# Patient Record
Sex: Female | Born: 1965 | Race: White | Hispanic: No | Marital: Single | Smoking: Never smoker
Health system: Southern US, Community
[De-identification: ages and names within clinical notes are randomized; demographics above are authoritative.]

## PROBLEM LIST (undated history)

## (undated) DIAGNOSIS — I10 Essential (primary) hypertension: Secondary | ICD-10-CM

## (undated) DIAGNOSIS — R42 Dizziness and giddiness: Secondary | ICD-10-CM

---

## 2015-03-21 ENCOUNTER — Emergency Department (INDEPENDENT_AMBULATORY_CARE_PROVIDER_SITE_OTHER)
Admission: EM | Admit: 2015-03-21 | Discharge: 2015-03-21 | Disposition: A | Discharge status: Home / Self Care | Payer: 59 | Source: Home / Self Care | Attending: Family Medicine | Admitting: Family Medicine

## 2015-03-21 ENCOUNTER — Encounter (HOSPITAL_COMMUNITY): Payer: Self-pay | Admitting: Emergency Medicine

## 2015-03-21 DIAGNOSIS — S39012A Strain of muscle, fascia and tendon of lower back, initial encounter: Secondary | ICD-10-CM

## 2015-03-21 MED ORDER — IBUPROFEN 800 MG PO TABS: 800.0000 mg | ORAL_TABLET | Freq: Three times a day (TID) | ORAL | Status: DC

## 2015-03-21 MED ORDER — CYCLOBENZAPRINE HCL 5 MG PO TABS: 5.0000 mg | ORAL_TABLET | Freq: Three times a day (TID) | ORAL | Status: DC | PRN

## 2015-03-21 NOTE — ED Notes (Signed)
Back pain that started today.

## 2015-03-21 NOTE — Discharge Instructions (Signed)
Heat and medicine as needed, avoid prolonged sitting as discussed, see orthopedist if further problems.

## 2015-03-21 NOTE — ED Provider Notes (Signed)
CSN: [id number]     Arrival date & time 03/21/15  1624 History   First MD Initiated Contact with Patient 03/21/15 1649     No chief complaint on file.  (Consider location/radiation/quality/duration/timing/severity/associated sxs/prior Treatment) Patient is a 49 y.o. female presenting with back pain. The history is provided by the patient.  Back Pain Location:  Lumbar spine Quality:  Shooting Radiates to:  Does not radiate Pain severity:  Moderate Onset quality:  Sudden Duration:  1 day Timing:  Constant Chronicity:  Recurrent (had back strain 1 week ago, mostly resolved over week, bent over today and pain recurred) Context: recent injury   Worsened by:  Nothing tried Ineffective treatments:  None tried Associated symptoms: no abdominal pain, no dysuria, no fever, no leg pain, no numbness, no paresthesias, no tingling and no weakness   Risk factors: lack of exercise and obesity     No past medical history on file. No past surgical history on file. No family history on file. Social History  Substance Use Topics  . Smoking status: Not on file  . Smokeless tobacco: Not on file  . Alcohol Use: Not on file   OB History    No data available     Review of Systems  Constitutional: Negative for fever.  Gastrointestinal: Negative.  Negative for abdominal pain.  Genitourinary: Negative.  Negative for dysuria.  Musculoskeletal: Positive for myalgias and back pain. Negative for joint swelling and gait problem.  Skin: Negative.   Neurological: Negative for tingling, weakness, numbness and paresthesias.    Allergies  Review of patient's allergies indicates not on file.  Home Medications   Prior to Admission medications   Medication Sig Start Date End Date Taking? Authorizing Provider  cyclobenzaprine (FLEXERIL) 5 MG tablet Take 1 tablet (5 mg total) by mouth 3 (three) times daily as needed for muscle spasms. 03/21/15   Linna Hoff, MD  ibuprofen (ADVIL,MOTRIN) 800 MG tablet Take  1 tablet (800 mg total) by mouth 3 (three) times daily. 03/21/15   Linna Hoff, MD   Meds Ordered and Administered this Visit  Medications - No data to display  BP 102/58 mmHg  Pulse 97  Temp(Src) 98.1 F (36.7 C) (Oral)  Resp 16  SpO2 100% No data found.   Physical Exam  Constitutional: She is oriented to person, place, and time. She appears well-developed and well-nourished. She appears distressed.  Abdominal: Soft. Bowel sounds are normal.  Musculoskeletal: She exhibits tenderness.       Lumbar back: She exhibits decreased range of motion, tenderness, pain and spasm. She exhibits no swelling.  Neurological: She is alert and oriented to person, place, and time.  Skin: Skin is warm and dry.  Nursing note and vitals reviewed.   ED Course  Procedures (including critical care time)  Labs Review Labs Reviewed - No data to display  Imaging Review No results found.   Visual Acuity Review  Right Eye Distance:   Left Eye Distance:   Bilateral Distance:    Right Eye Near:   Left Eye Near:    Bilateral Near:         MDM   1. Low back strain, initial encounter        Linna Hoff, MD 03/21/15 1730

## 2016-12-29 ENCOUNTER — Encounter (HOSPITAL_COMMUNITY): Payer: Self-pay

## 2016-12-29 ENCOUNTER — Ambulatory Visit (HOSPITAL_COMMUNITY): Payer: Managed Care, Other (non HMO)

## 2016-12-29 ENCOUNTER — Ambulatory Visit (HOSPITAL_COMMUNITY)
Admission: EM | Admit: 2016-12-29 | Discharge: 2016-12-29 | Disposition: A | Discharge status: Home / Self Care | Payer: Managed Care, Other (non HMO) | Attending: Family Medicine | Admitting: Family Medicine

## 2016-12-29 ENCOUNTER — Ambulatory Visit (INDEPENDENT_AMBULATORY_CARE_PROVIDER_SITE_OTHER): Payer: Managed Care, Other (non HMO)

## 2016-12-29 DIAGNOSIS — W19XXXA Unspecified fall, initial encounter: Secondary | ICD-10-CM | POA: Diagnosis not present

## 2016-12-29 DIAGNOSIS — S93602A Unspecified sprain of left foot, initial encounter: Secondary | ICD-10-CM | POA: Diagnosis not present

## 2016-12-29 MED ORDER — NAPROXEN 500 MG PO TABS: 500.0000 mg | ORAL_TABLET | Freq: Two times a day (BID) | ORAL | 0 refills | Status: DC

## 2016-12-29 NOTE — ED Provider Notes (Signed)
CSN: [id number]     Arrival date & time 12/29/16  [id number] History   None    Chief Complaint  Patient presents with  . Fall   (Consider location/radiation/quality/duration/timing/severity/associated sxs/prior Treatment) Patient fell and injured her left foot and she has left foot pain.   The history is provided by the patient.  Fall  This is a new problem. The problem occurs constantly. The problem has not changed since onset.Nothing aggravates the symptoms. Nothing relieves the symptoms. She has tried nothing for the symptoms.    History reviewed. No pertinent past medical history. History reviewed. No pertinent surgical history. No family history on file. Social History  Substance Use Topics  . Smoking status: Never Smoker  . Smokeless tobacco: Never Used  . Alcohol use No   OB History    No data available     Review of Systems  Constitutional: Negative.   HENT: Negative.   Eyes: Negative.   Respiratory: Negative.   Cardiovascular: Negative.   Gastrointestinal: Negative.   Endocrine: Negative.   Genitourinary: Negative.   Musculoskeletal: Positive for arthralgias.  Allergic/Immunologic: Negative.   Neurological: Negative.   Hematological: Negative.   Psychiatric/Behavioral: Negative.     Allergies  Patient has no known allergies.  Home Medications   Prior to Admission medications   Medication Sig Start Date End Date Taking? Authorizing Provider  cyclobenzaprine (FLEXERIL) 5 MG tablet Take 1 tablet (5 mg total) by mouth 3 (three) times daily as needed for muscle spasms. 03/21/15   Linna HoffKindl, James D, MD  ibuprofen (ADVIL,MOTRIN) 800 MG tablet Take 1 tablet (800 mg total) by mouth 3 (three) times daily. 03/21/15   Linna HoffKindl, James D, MD  naproxen (NAPROSYN) 500 MG tablet Take 1 tablet (500 mg total) by mouth 2 (two) times daily with a meal. 12/29/16   [place], Anselm PancoastWilliam J, FNP  naproxen sodium (ANAPROX) 220 MG tablet Take 220 mg by mouth 2 (two) times daily with a meal.     [provider]   Meds Ordered and Administered this Visit  Medications - No data to display  BP (!) 144/93   Pulse 91   Temp 98.3 F (36.8 C) (Oral)   Resp 20   LMP 10/04/2016 (Approximate) Comment: denies pregnancy  SpO2 100%  No data found.   Physical Exam  Constitutional: She appears well-developed and well-nourished.  HENT:  Head: Normocephalic.  Right Ear: External ear normal.  Left Ear: External ear normal.  Mouth/Throat: Oropharynx is clear and moist.  Eyes: Conjunctivae and EOM are normal. Pupils are equal, round, and reactive to light.  Neck: Normal range of motion. Neck supple.  Cardiovascular: Normal rate, regular rhythm and normal heart sounds.   Pulmonary/Chest: Effort normal and breath sounds normal.  Musculoskeletal: She exhibits tenderness.  Left foot pain at first metacarpal and first toe.  Nursing note and vitals reviewed.   Urgent Care Course     Procedures (including critical care time)  Labs Review Labs Reviewed - No data to display  Imaging Review Dg Foot Complete Left  Result Date: 12/29/2016 CLINICAL DATA:  Left foot pain at the base of the great toe and in the region of the second and third metatarsals after slipping on water and falling down steps last night. EXAM: LEFT FOOT - COMPLETE 3+ VIEW COMPARISON:  None. FINDINGS: There is no evidence of fracture or dislocation. There is no evidence of arthropathy or other focal bone abnormality. Soft tissues are unremarkable. IMPRESSION: Normal examination. Electronically Signed  By: Beckie Salts M.D.   On: 12/29/2016 10:50     Visual Acuity Review  Right Eye Distance:   Left Eye Distance:   Bilateral Distance:    Right Eye Near:   Left Eye Near:    Bilateral Near:         MDM   1. Fall, initial encounter   2. Foot sprain, left, initial encounter    Cam walker left foot Naprosyn 500mg  one po bid x 10 days      Deatra Canter, FNP 12/29/16 1129    Deatra Canter, FNP 12/29/16 1224

## 2016-12-29 NOTE — ED Triage Notes (Signed)
Pt said she fell outside her house on concrete steps last night and hurt her left foot. Said it hurts to walk on it and it's swollen. Did take tylenol. Does work on her feet so she wanted to make sure it's okay before work today

## 2017-02-14 ENCOUNTER — Ambulatory Visit (HOSPITAL_COMMUNITY)
Admission: EM | Admit: 2017-02-14 | Discharge: 2017-02-14 | Disposition: A | Discharge status: Home / Self Care | Payer: Managed Care, Other (non HMO) | Source: Home / Self Care | Attending: Family Medicine | Admitting: Family Medicine

## 2017-02-14 ENCOUNTER — Encounter (HOSPITAL_COMMUNITY): Payer: Self-pay | Admitting: Emergency Medicine

## 2017-02-14 ENCOUNTER — Encounter (HOSPITAL_COMMUNITY): Payer: Self-pay

## 2017-02-14 DIAGNOSIS — R531 Weakness: Secondary | ICD-10-CM | POA: Insufficient documentation

## 2017-02-14 DIAGNOSIS — I10 Essential (primary) hypertension: Secondary | ICD-10-CM | POA: Insufficient documentation

## 2017-02-14 DIAGNOSIS — Z79899 Other long term (current) drug therapy: Secondary | ICD-10-CM | POA: Diagnosis not present

## 2017-02-14 DIAGNOSIS — R42 Dizziness and giddiness: Secondary | ICD-10-CM

## 2017-02-14 HISTORY — DX: Essential (primary) hypertension: I10

## 2017-02-14 HISTORY — DX: Dizziness and giddiness: R42

## 2017-02-14 LAB — URINALYSIS, ROUTINE W REFLEX MICROSCOPIC
BILIRUBIN URINE: NEGATIVE
Glucose, UA: NEGATIVE mg/dL
Hgb urine dipstick: NEGATIVE
Ketones, ur: NEGATIVE mg/dL
LEUKOCYTES UA: NEGATIVE
NITRITE: NEGATIVE
PROTEIN: NEGATIVE mg/dL
Specific Gravity, Urine: 1.01 (ref 1.005–1.030)
pH: 6 (ref 5.0–8.0)

## 2017-02-14 LAB — BASIC METABOLIC PANEL
Anion gap: 9 (ref 5–15)
BUN: 10 mg/dL (ref 6–20)
CALCIUM: 9.4 mg/dL (ref 8.9–10.3)
CO2: 27 mmol/L (ref 22–32)
Chloride: 103 mmol/L (ref 101–111)
Creatinine, Ser: 0.6 mg/dL (ref 0.44–1.00)
GFR calc Af Amer: >60 mL/min (ref >60)
GLUCOSE: 121 mg/dL — AB (ref 65–99)
POTASSIUM: 3.8 mmol/L (ref 3.5–5.1)
SODIUM: 139 mmol/L (ref 135–145)

## 2017-02-14 LAB — CBC
HCT: 42.6 % (ref 36.0–46.0)
Hemoglobin: 13.4 g/dL (ref 12.0–15.0)
MCH: 27 pg (ref 26.0–34.0)
MCHC: 31.5 g/dL (ref 30.0–36.0)
MCV: 85.9 fL (ref 78.0–100.0)
Platelets: 170 10*3/uL (ref 150–400)
RBC: 4.96 MIL/uL (ref 3.87–5.11)
RDW: 13.6 % (ref 11.5–15.5)
WBC: 9.1 10*3/uL (ref 4.0–10.5)

## 2017-02-14 NOTE — ED Triage Notes (Signed)
Pt here for weakness, and not feeling normal. Describes as just feels weird, sts hx of syncope since childhood and sts now nauseated, dizzy, [facility] at [facility] and sent here for furthur workup. Heart racing

## 2017-02-14 NOTE — Discharge Instructions (Signed)
If you are not improving tonight or feel you are worsening please proceed to the Emergency Department for evaluation.

## 2017-02-14 NOTE — ED Triage Notes (Signed)
History of dizzy episodes, reporting as far back as childhood.  Episodes have never been diagnosed.  This episode has lasted longer than usual.  Denies pain.  Patient feels nauseated and dizzy.  Then symptoms resolve, then reoccur

## 2017-02-15 ENCOUNTER — Emergency Department (HOSPITAL_COMMUNITY)
Admission: EM | Admit: 2017-02-15 | Discharge: 2017-02-15 | Disposition: A | Discharge status: Home / Self Care | Payer: Managed Care, Other (non HMO) | Attending: Emergency Medicine | Admitting: Emergency Medicine

## 2017-02-15 DIAGNOSIS — R531 Weakness: Secondary | ICD-10-CM

## 2017-02-15 LAB — I-STAT TROPONIN, ED: Troponin i, poc: 0 ng/mL (ref 0.00–0.08)

## 2017-02-15 NOTE — Discharge Instructions (Signed)
Cut down on caffeine. Please follow-up with family doctor if continued to have symptoms. Please return if worsening.

## 2017-02-15 NOTE — ED Provider Notes (Signed)
[facility] Provider Note   CSN: [id number] Arrival date & time: 02/14/17  2227     History   Chief Complaint Chief Complaint  Patient presents with  . Weakness    HPI Wendy Hooper is a 51 y.o. female.  HPI Wendy Hooper is a [age] y.o. female with history of hypertension and dizziness presents to emergency department complaining of generalized weakness. Pt states that she has been feeling "woozy" starting this afternoon and this evening. She reports history of syncope and dizziness since she was a teenager. She states that she has not had any symptoms recently. She states she went to urgent care earlier and had EKG done which was normal and was told that if her symptoms worsen to come to the ER. Patient states that she went to bed, and anytime she tried to sleep, she would go palpitations and she felt "uneasy." She decided to come to the ED for evaluation. She denies any chest pain. She denies any nausea or vomiting. She states she just feels generally weak and shaky. She admitted that she did not drink plenty of water today but drank 5 large glasses of Pepsi. She states she normally only drinks 1 glass maximum a day. She denies any other new medications or supplements. No other complaints.  Past Medical History:  Diagnosis Date  . Dizziness   . Hypertension     There are no active problems to display for this patient.   History reviewed. No pertinent surgical history.  OB History    No data available       Home Medications    Prior to Admission medications   Medication Sig Start Date End Date Taking? Authorizing Provider  losartan (COZAAR) 25 MG tablet Take 25 mg by mouth daily.   Yes [provider]  cyclobenzaprine (FLEXERIL) 5 MG tablet Take 1 tablet (5 mg total) by mouth 3 (three) times daily as needed for muscle spasms. Patient not taking: Reported on 02/15/2017 03/21/15   Linna HoffKindl, James D, MD  ibuprofen (ADVIL,MOTRIN) 800 MG tablet Take 1  tablet (800 mg total) by mouth 3 (three) times daily. Patient not taking: Reported on 02/15/2017 03/21/15   Linna HoffKindl, James D, MD  naproxen (NAPROSYN) 500 MG tablet Take 1 tablet (500 mg total) by mouth 2 (two) times daily with a meal. Patient not taking: Reported on 02/15/2017 12/29/16   Deatra Canterxford, William J, FNP    Family History History reviewed. No pertinent family history.  Social History Social History  Substance Use Topics  . Smoking status: Never Smoker  . Smokeless tobacco: Never Used  . Alcohol use No     Allergies   Patient has no known allergies.   Review of Systems Review of Systems  Constitutional: Negative for chills and fever.  Respiratory: Negative for cough, chest tightness and shortness of breath.   Cardiovascular: Positive for palpitations. Negative for chest pain and leg swelling.  Gastrointestinal: Negative for abdominal pain, diarrhea, nausea and vomiting.  Genitourinary: Negative for dysuria, flank pain, pelvic pain, vaginal bleeding, vaginal discharge and vaginal pain.  Musculoskeletal: Negative for arthralgias, myalgias, neck pain and neck stiffness.  Skin: Negative for rash.  Neurological: Positive for weakness. Negative for dizziness and headaches.  All other systems reviewed and are negative.    Physical Exam Updated Vital Signs BP (!) 155/82 (BP Location: Right Arm)   Pulse 74   Temp 97.8 F (36.6 C) (Oral)   Resp 20   SpO2 99%   Physical  Exam  Constitutional: She is oriented to person, place, and time. She appears well-developed and well-nourished. No distress.  HENT:  Head: Normocephalic.  Eyes: Conjunctivae are normal.  Neck: Neck supple.  Cardiovascular: Normal rate, regular rhythm and normal heart sounds.   Pulmonary/Chest: Effort normal and breath sounds normal. No respiratory distress. She has no wheezes. She has no rales.  Abdominal: Soft. Bowel sounds are normal. She exhibits no distension. There is no tenderness. There is no rebound.    Musculoskeletal: She exhibits no edema.  Neurological: She is alert and oriented to person, place, and time.  Skin: Skin is warm and dry.  Psychiatric: She has a normal mood and affect. Her behavior is normal.  Nursing note and vitals reviewed.    ED Treatments / Results  Labs (all labs ordered are listed, but only abnormal results are displayed) Labs Reviewed  BASIC METABOLIC PANEL - Abnormal; Notable for the following:       Result Value   Glucose, Bld 121 (*)    All other components within normal limits  URINALYSIS, ROUTINE W REFLEX MICROSCOPIC - Abnormal; Notable for the following:    Color, Urine STRAW (*)    All other components within normal limits  CBC  CBG MONITORING, ED  I-STAT TROPONIN, ED    EKG  EKG Interpretation None       Radiology No results found.  Procedures Procedures (including critical care time)  Medications Ordered in ED Medications - No data to display   Initial Impression / Assessment and Plan / ED Course  I have reviewed the triage vital signs and the nursing notes.  Pertinent labs & imaging results that were available during my care of the patient were reviewed by me and considered in my medical decision making (see chart for details).     Pt with weakness and jitterness, question from large amount of caffeine she took in today. Her CBC and bmet are normal. Will add ECG and trop.   Patient's EKG is normal. Troponin is 0. She is mildly orthostatic. Discussed drinking plenty of fluids at home. Reducing caffeine intake. Follow-up with primary care doctor. VS normal at this time. Stable for discharge home.   Vitals:   02/15/17 0315 02/15/17 0318 02/15/17 0330 02/15/17 0345  BP: 129/78 128/73 116/72 123/74  Pulse: (!) 102 84 74 81  Resp: 20 18 17 18   Temp:  97.7 F (36.5 C)    TempSrc:  Oral    SpO2: 99% 98% 99% 97%     Final Clinical Impressions(s) / ED Diagnoses   Final diagnoses:  Generalized weakness    New  Prescriptions Discharge Medication List as of 02/15/2017  3:54 AM       Jaynie CrumbleKirichenko, Temple Sporer, PA-C 02/15/17 0640    Dione BoozeGlick, David, MD 02/15/17 [phone]

## 2017-02-15 NOTE — ED Notes (Signed)
Pt denies nausea at this time. Pt also reports a heart fluttering feeling upon waking that started yesterday.

## 2017-02-17 NOTE — ED Provider Notes (Signed)
  [facility]   [id number] 02/14/17 Arrival Time: 1802  ASSESSMENT & PLAN:  1. Lightheadedness    Possibly BPPV. Discussed further evaluation with ENT. She will discuss with PCP. Reviewed expectations re: course of current medical issues. Questions answered. Outlined signs and symptoms indicating need for more acute intervention. Patient verbalized understanding. After Visit Summary given.   SUBJECTIVE:  Vedia PereyraMargaret E Durr is a 51 y.o. female who presents with complaint of lightheadedness. On/off symptoms for at least a week. H/O similar; since childhood. But usually resolve in a few days. Mild associated nausea without emesis. Does have periods during day without symptoms. No hearing or visual changes. No headaches. No new medications or recent illnesses. Current symptoms are worse with movements of head. Better when she is still and resting. Does describe lightheadedness as vertigo.  ROS: As per HPI.   OBJECTIVE:  Vitals:   02/14/17 1830  BP: (!) 156/105  Pulse: 98  Resp: 20  Temp: 98.1 F (36.7 C)  TempSrc: Oral  SpO2: 100%     General appearance: alert; no distress HEENT: normocephalic; atraumatic; conjunctivae normal; TMs normal; nasal mucosa normal; oral mucosa normal Neck: supple Lungs: clear to auscultation bilaterally Heart: regular rate and rhythm Extremities: no cyanosis or edema; symmetrical with no gross deformities Skin: warm and dry Neurologic: normal symmetric reflexes; normal gait Psychological:  alert and cooperative; normal mood and affect  <ECGINTERP> ECG with NSR.  No Known Allergies  PMHx, SurgHx, SocialHx, Medications, and Allergies were reviewed in the Visit Navigator and updated as appropriate.      Mardella LaymanHagler, Ella Golomb, MD 02/17/17 [phone]

## 2017-07-09 ENCOUNTER — Ambulatory Visit (HOSPITAL_COMMUNITY)
Admission: EM | Admit: 2017-07-09 | Discharge: 2017-07-09 | Disposition: A | Discharge status: Home / Self Care | Payer: Managed Care, Other (non HMO) | Attending: Family Medicine | Admitting: Family Medicine

## 2017-07-09 ENCOUNTER — Encounter (HOSPITAL_COMMUNITY): Payer: Self-pay | Admitting: *Deleted

## 2017-07-09 ENCOUNTER — Other Ambulatory Visit: Payer: Self-pay

## 2017-07-09 DIAGNOSIS — R42 Dizziness and giddiness: Secondary | ICD-10-CM | POA: Diagnosis not present

## 2017-07-09 MED ORDER — MECLIZINE HCL 25 MG PO TABS: 25.0000 mg | ORAL_TABLET | Freq: Three times a day (TID) | ORAL | 0 refills | Status: DC | PRN

## 2017-07-09 NOTE — ED Provider Notes (Signed)
[facility]    CSN: [id number] Arrival date & time: 07/09/17  1159     History   Chief Complaint Chief Complaint  Patient presents with  . Dizziness    HPI Wendy Hooper is a 51 y.o. female.   51 year old female states that for the past 2 weeks she has had "the bed spins" vertigo. She states this, a little better. She will he has not now when lying supine on her turning over in bed. It started a couple weeks ago. No nausea or vomiting. No abdominal pain. She does have chronic intermittent tinnitus which has been worse in the past couple weeks as well. She has seen many clinic which prescribed her Augmentin for fluid in the ear. She states she has got a little bit better as she has taken Mucinex along with it. She stopped the Augmentin because it was making her sick.      Past Medical History:  Diagnosis Date  . Dizziness   . Hypertension     There are no active problems to display for this patient.   History reviewed. No pertinent surgical history.  OB History    No data available       Home Medications    Prior to Admission medications   Medication Sig Start Date End Date Taking? Authorizing Provider  Amoxicillin-Pot Clavulanate (AUGMENTIN PO) Take 200 mg by mouth.   Yes [provider]  GuaiFENesin (MUCINEX PO) Take by mouth.   Yes [provider]  losartan (COZAAR) 25 MG tablet Take 25 mg by mouth daily.   Yes [provider]  meclizine (ANTIVERT) 25 MG tablet Take 1 tablet (25 mg total) by mouth 3 (three) times daily as needed for dizziness. 07/09/17   Hayden RasmussenMabe, Camaria Gerald, NP    Family History Family History  Problem Relation Age of Onset  . Hypertension Mother   . Hyperlipidemia Mother   . Hyperlipidemia Father   . COPD Father     Social History Social History   Tobacco Use  . Smoking status: Never Smoker  . Smokeless tobacco: Never Used  Substance Use Topics  . Alcohol use: No  . Drug use: No      Allergies   Patient has no known allergies.   Review of Systems Review of Systems  Constitutional: Negative.   HENT: Positive for postnasal drip.   Respiratory: Negative.   Gastrointestinal: Positive for diarrhea.  Neurological: Positive for dizziness. Negative for tremors, seizures, syncope, facial asymmetry, speech difficulty and headaches.  All other systems reviewed and are negative.    Physical Exam Triage Vital Signs ED Triage Vitals [07/09/17 1307]  Enc Vitals Group     BP (!) 153/81     Pulse      Resp      Temp 98.1 F (36.7 C)     Temp Source Oral     SpO2 100 %     Weight      Height      Head Circumference      Peak Flow      Pain Score      Pain Loc      Pain Edu?      Excl. in GC?    No data found.  Updated Vital Signs BP (!) 153/81 (BP Location: Left Arm)   Temp 98.1 F (36.7 C) (Oral)   SpO2 100%   Visual Acuity Right Eye Distance:   Left Eye Distance:   Bilateral Distance:  Right Eye Near:   Left Eye Near:    Bilateral Near:     Physical Exam  Constitutional: She is oriented to person, place, and time. She appears well-developed and well-nourished. No distress.  HENT:  Bilateral TMs pearly gray, transparent. Retracted. Normal light reflex. No erythema or bulging. No sign of infection. Oropharynx with light clear drainage, cobblestoning and minor streaky erythema in the posterior pharynx  Eyes: EOM are normal.  Neck: Normal range of motion. Neck supple.  Cardiovascular: Normal rate.  Pulmonary/Chest: Effort normal. No respiratory distress.  Musculoskeletal: She exhibits no edema.  Neurological: She is alert and oriented to person, place, and time. She exhibits normal muscle tone.  Skin: Skin is warm and dry.  Psychiatric: She has a normal mood and affect.  Nursing note and vitals reviewed.    UC Treatments / Results  Labs (all labs ordered are listed, but only abnormal results are displayed) Labs Reviewed - No data to  display  EKG  EKG Interpretation None       Radiology No results found.  Procedures Procedures (including critical care time)  Medications Ordered in UC Medications - No data to display   Initial Impression / Assessment and Plan / UC Course  I have reviewed the triage vital signs and the nursing notes.  Pertinent labs & imaging results that were available during my care of the patient were reviewed by me and considered in my medical decision making (see chart for details).       Final Clinical Impressions(s) / UC Diagnoses   Final diagnoses:  Vertigo    ED Discharge Orders        Ordered    meclizine (ANTIVERT) 25 MG tablet  3 times daily PRN     07/09/17 1405       Controlled Substance Prescriptions [place] Controlled Substance Registry consulted? Not Applicable   Hayden RasmussenMabe, Jahkai Yandell, NP 07/09/17 1409

## 2017-07-09 NOTE — ED Triage Notes (Signed)
Per pt it started 2 wks ago, per pt she feels like she is having vertigo or an ear infections, per pt when she lays down the room spins, per pt when she is up and walking she do not have the symptoms,

## 2017-09-15 ENCOUNTER — Encounter (HOSPITAL_COMMUNITY): Payer: Self-pay | Admitting: *Deleted

## 2017-09-15 ENCOUNTER — Other Ambulatory Visit: Payer: Self-pay

## 2017-09-15 ENCOUNTER — Ambulatory Visit (HOSPITAL_COMMUNITY)
Admission: EM | Admit: 2017-09-15 | Discharge: 2017-09-15 | Disposition: A | Discharge status: Home / Self Care | Payer: Self-pay | Attending: Family Medicine | Admitting: Family Medicine

## 2017-09-15 DIAGNOSIS — Z825 Family history of asthma and other chronic lower respiratory diseases: Secondary | ICD-10-CM | POA: Insufficient documentation

## 2017-09-15 DIAGNOSIS — Z8249 Family history of ischemic heart disease and other diseases of the circulatory system: Secondary | ICD-10-CM | POA: Insufficient documentation

## 2017-09-15 DIAGNOSIS — R6889 Other general symptoms and signs: Secondary | ICD-10-CM

## 2017-09-15 DIAGNOSIS — I10 Essential (primary) hypertension: Secondary | ICD-10-CM | POA: Insufficient documentation

## 2017-09-15 DIAGNOSIS — R51 Headache: Secondary | ICD-10-CM | POA: Insufficient documentation

## 2017-09-15 DIAGNOSIS — R6883 Chills (without fever): Secondary | ICD-10-CM | POA: Insufficient documentation

## 2017-09-15 DIAGNOSIS — R5383 Other fatigue: Secondary | ICD-10-CM | POA: Insufficient documentation

## 2017-09-15 DIAGNOSIS — R05 Cough: Secondary | ICD-10-CM | POA: Insufficient documentation

## 2017-09-15 LAB — POCT RAPID STREP A: Streptococcus, Group A Screen (Direct): NEGATIVE

## 2017-09-15 MED ORDER — OSELTAMIVIR PHOSPHATE 75 MG PO CAPS: 75.0000 mg | ORAL_CAPSULE | Freq: Two times a day (BID) | ORAL | 0 refills | Status: DC

## 2017-09-15 NOTE — ED Triage Notes (Signed)
Fatigue, cough, sore throat, body aches, headaches.

## 2017-09-15 NOTE — Discharge Instructions (Signed)
Continue to push fluids, practice good hand hygiene, and cover your mouth if you cough. ° °If you start having fevers, shaking or shortness of breath, seek immediate care. ° °Ibuprofen 400-600 mg (2-3 over the counter strength tabs) every 6 hours as needed for pain. ° °OK to take Tylenol 1000 mg (2 extra strength tabs) or 975 mg (3 regular strength tabs) every 6 hours as needed. ° ° °

## 2017-09-15 NOTE — ED Provider Notes (Signed)
  [facility]    CSN: [id number] Arrival date & time: 09/15/17  1335  Chief Complaint  Patient presents with  . Cough  . Fatigue  . Chills  . Headache    Wendy PereyraMargaret E Hooper here for URI complaints.  Duration: 1 day  Associated symptoms: Fever (99's), sinus congestion, rhinorrhea, myalgia and cough Denies: sinus pain, itchy watery eyes, ear pain, ear drainage, sore throat, wheezing and shortness of breath Treatment to date: none Sick contacts: No  ROS:  Const: +fevers HEENT: As noted in HPI Lungs: No SOB  Past Medical History:  Diagnosis Date  . Dizziness   . Hypertension    Family History  Problem Relation Age of Onset  . Hypertension Mother   . Hyperlipidemia Mother   . Hyperlipidemia Father   . COPD Father     BP (!) 146/98 (BP Location: Left Wrist)   Pulse 90   Temp (!) 97.5 F (36.4 C) (Oral)   SpO2 99%  General: Awake, alert, appears stated age HEENT: AT, [place], ears patent b/l and TM's neg, nares patent w/o discharge, pharynx pink and without exudates, MMM Neck: No masses or asymmetry Heart: RRR Lungs: CTAB, no accessory muscle use Psych: Age appropriate judgment and insight, normal mood and affect  Flu-like symptoms  Tamiflu 75 mg BID for 5 days.  Continue to push fluids, practice good hand hygiene, cover mouth when coughing. F/u prn. If starting to experience increasing fevers, shaking, or shortness of breath, seek immediate care. Pt voiced understanding and agreement to the plan.   Wendy Hooper, Wendy Hooper, [place] 09/15/17 2232

## 2017-09-18 LAB — CULTURE, GROUP A STREP (THRC)

## 2018-01-21 ENCOUNTER — Emergency Department (HOSPITAL_COMMUNITY)
Admission: EM | Admit: 2018-01-21 | Discharge: 2018-01-21 | Disposition: A | Discharge status: Home / Self Care | Payer: Self-pay | Attending: Emergency Medicine | Admitting: Emergency Medicine

## 2018-01-21 ENCOUNTER — Encounter (HOSPITAL_COMMUNITY): Payer: Self-pay | Admitting: Emergency Medicine

## 2018-01-21 ENCOUNTER — Other Ambulatory Visit: Payer: Self-pay

## 2018-01-21 DIAGNOSIS — I10 Essential (primary) hypertension: Secondary | ICD-10-CM | POA: Insufficient documentation

## 2018-01-21 DIAGNOSIS — Z79899 Other long term (current) drug therapy: Secondary | ICD-10-CM | POA: Insufficient documentation

## 2018-01-21 DIAGNOSIS — M71552 Other bursitis, not elsewhere classified, left hip: Secondary | ICD-10-CM | POA: Insufficient documentation

## 2018-01-21 DIAGNOSIS — M7110 Other infective bursitis, unspecified site: Secondary | ICD-10-CM

## 2018-01-21 MED ORDER — CLINDAMYCIN HCL 150 MG PO CAPS: 300.0000 mg | ORAL_CAPSULE | Freq: Four times a day (QID) | ORAL | 0 refills | Status: DC

## 2018-01-21 MED ORDER — ACETAMINOPHEN 325 MG PO TABS
650.0000 mg | ORAL_TABLET | Freq: Once | ORAL | Status: AC
  Administered 2018-01-21: 650 mg via ORAL
  Filled 2018-01-21: qty 2

## 2018-01-21 MED ORDER — CLINDAMYCIN HCL 300 MG PO CAPS
600.0000 mg | ORAL_CAPSULE | Freq: Once | ORAL | Status: AC
  Administered 2018-01-21: 600 mg via ORAL
  Filled 2018-01-21: qty 2

## 2018-01-21 MED ORDER — NAPROXEN 500 MG PO TABS
500.0000 mg | ORAL_TABLET | Freq: Once | ORAL | Status: AC
  Administered 2018-01-21: 500 mg via ORAL
  Filled 2018-01-21: qty 1

## 2018-01-21 NOTE — ED Provider Notes (Signed)
[facility]-EMERGENCY DEPT Provider Note   CSN: [id number] Arrival date & time: 01/21/18  0057     History   Chief Complaint Chief Complaint  Patient presents with  . Bursitis    HPI Wendy Hooper is a 52 y.o. female.  HPI  52 year old female comes in with chief complaint of pain in her gluteal region. Patient reports that she started having left gluteal pain about 5 days ago.  She has been seeing a [facility] and has a working diagnosis of ischium bursitis.  Patient has been taking the medications as prescribed, without any relief.  She now started spiking a low-grade fever and the area of swelling and pain was getting larger therefore she decided to come to the ER.  Patient has no medical history and she is immunocompetent.   Past Medical History:  Diagnosis Date  . Dizziness   . Hypertension     There are no active problems to display for this patient.   History reviewed. No pertinent surgical history.   OB History   None      Home Medications    Prior to Admission medications   Medication Sig Start Date End Date Taking? Authorizing Provider  clindamycin (CLEOCIN) 150 MG capsule Take 2 capsules (300 mg total) by mouth 4 (four) times daily for 7 days. 01/21/18 01/28/18  Derwood Kaplan, MD  losartan (COZAAR) 25 MG tablet Take 25 mg by mouth daily.    [provider]  oseltamivir (TAMIFLU) 75 MG capsule Take 1 capsule (75 mg total) by mouth every 12 (twelve) hours. 09/15/17   Sharlene Dory, DO    Family History Family History  Problem Relation Age of Onset  . Hypertension Mother   . Hyperlipidemia Mother   . Hyperlipidemia Father   . COPD Father     Social History Social History   Tobacco Use  . Smoking status: Never Smoker  . Smokeless tobacco: Never Used  Substance Use Topics  . Alcohol use: No  . Drug use: No     Allergies   Patient has no known allergies.   Review of Systems Review of Systems    Constitutional: Positive for activity change.  Cardiovascular: Negative for palpitations.  Gastrointestinal: Negative for abdominal pain, nausea, rectal pain and vomiting.  Skin: Positive for rash.  Allergic/Immunologic: Negative for immunocompromised state.  All other systems reviewed and are negative.    Physical Exam Updated Vital Signs BP 133/70   Pulse 96   Temp 97.9 F (36.6 C) (Oral)   Resp 16   Ht 5\' 4"  (1.626 m)   Wt 108.9 kg (240 lb)   SpO2 98%   BMI 41.20 kg/m   Physical Exam  Constitutional: She is oriented to person, place, and time. She appears well-developed.  HENT:  Head: Normocephalic and atraumatic.  Eyes: EOM are normal.  Neck: Normal range of motion. Neck supple.  Cardiovascular: Normal rate.  Pulmonary/Chest: Effort normal.  Abdominal: Bowel sounds are normal.  Large area of edema and erythema with tenderness to palpation at the medial aspect of the gluteal maximus inferiorly. No crepitus.    Neurological: She is alert and oriented to person, place, and time.  Skin: Skin is warm and dry.  Nursing note and vitals reviewed.    ED Treatments / Results  Labs (all labs ordered are listed, but only abnormal results are displayed) Labs Reviewed - No data to display  EKG None  Radiology No results found.  Procedures Procedures (including  critical care time)  ULTRASOUND LIMITED SOFT TISSUE/ MUSCULOSKELETAL: Left gluteal Indication: edema and tenderness -r/o abscess Linear probe used to evaluate area of interest in two planes. Findings:  No evidence of pus pocket. + inflammatory changes Performed by: Dr. Rhunette CroftNanavati Images saved electronically   Medications Ordered in ED Medications  clindamycin (CLEOCIN) capsule 600 mg (600 mg Oral Given 01/21/18 0355)  naproxen (NAPROSYN) tablet 500 mg (500 mg Oral Given 01/21/18 0355)  acetaminophen (TYLENOL) tablet 650 mg (650 mg Oral Given 01/21/18 0355)     Initial Impression / Assessment and Plan / ED  Course  I have reviewed the triage vital signs and the nursing notes.  Pertinent labs & imaging results that were available during my care of the patient were reviewed by me and considered in my medical decision making (see chart for details).     PT comes in with cc of gluteal pain. Differential diagnosis includes cellulitis versus gluteal abscess versus bursitis.  Given that patient is having fevers, if bursitis it will need to be drained.  We performed an ultrasound at the bedside and saw cobblestoning, indicative of inflammation, but there was no abscess pocket visible.  We will give patient follow-up with orthopedist, in case her symptoms do not resolve with clindamycin and she needs to bursa drained.  Final Clinical Impressions(s) / ED Diagnoses   Final diagnoses:  Septic bursitis    ED Discharge Orders        Ordered    clindamycin (CLEOCIN) 150 MG capsule  4 times daily     01/21/18 0334       Derwood KaplanNanavati, Lanna Labella, MD 01/21/18 [id number]

## 2018-01-21 NOTE — ED Notes (Signed)
Bed: WA22 Expected date:  Expected time:  Means of arrival:  Comments: ems 

## 2018-01-21 NOTE — ED Triage Notes (Signed)
Patient first noticed bursitis about a week. She has been seeing a chiropractor to treat it for about a week but noticed it flair up on Friday. She reports putting ice on it and staying off of it, but nothing helped the pain.

## 2018-01-21 NOTE — Discharge Instructions (Signed)
We signed the ER for your pain.  It appears that you likely have bursitis versus cellulitis, and we also favor the former.  There is no evidence of abscess on eye exam.  Please start the antibiotics.  Most of the bursitis if they are infected will respond to the antibiotics.  Schedule an appointment with the orthopedic surgeon in 5 days, if the symptoms are not better they might evaluate for drainage of the bursa.

## 2018-01-22 ENCOUNTER — Encounter (HOSPITAL_COMMUNITY): Payer: Self-pay | Admitting: Radiology

## 2018-01-22 ENCOUNTER — Emergency Department (HOSPITAL_COMMUNITY): Payer: 59

## 2018-01-22 ENCOUNTER — Inpatient Hospital Stay (HOSPITAL_COMMUNITY)
Admission: EM | Admit: 2018-01-22 | Discharge: 2018-01-25 | DRG: 357 | Disposition: A | Discharge status: Home Health | Payer: 59 | Attending: General Surgery | Admitting: General Surgery

## 2018-01-22 ENCOUNTER — Encounter (HOSPITAL_COMMUNITY): Admission: EM | Disposition: A | Discharge status: Home Health | Payer: Self-pay | Source: Home / Self Care

## 2018-01-22 ENCOUNTER — Other Ambulatory Visit: Payer: Self-pay

## 2018-01-22 DIAGNOSIS — Z825 Family history of asthma and other chronic lower respiratory diseases: Secondary | ICD-10-CM

## 2018-01-22 DIAGNOSIS — R000 Tachycardia, unspecified: Secondary | ICD-10-CM | POA: Diagnosis present

## 2018-01-22 DIAGNOSIS — Z8349 Family history of other endocrine, nutritional and metabolic diseases: Secondary | ICD-10-CM | POA: Diagnosis not present

## 2018-01-22 DIAGNOSIS — K610 Anal abscess: Secondary | ICD-10-CM | POA: Diagnosis not present

## 2018-01-22 DIAGNOSIS — N39498 Other specified urinary incontinence: Secondary | ICD-10-CM | POA: Diagnosis present

## 2018-01-22 DIAGNOSIS — I96 Gangrene, not elsewhere classified: Secondary | ICD-10-CM | POA: Diagnosis present

## 2018-01-22 DIAGNOSIS — K6139 Other ischiorectal abscess: Secondary | ICD-10-CM | POA: Diagnosis not present

## 2018-01-22 DIAGNOSIS — I10 Essential (primary) hypertension: Secondary | ICD-10-CM | POA: Diagnosis present

## 2018-01-22 DIAGNOSIS — Z8249 Family history of ischemic heart disease and other diseases of the circulatory system: Secondary | ICD-10-CM

## 2018-01-22 HISTORY — PX: INCISION AND DRAINAGE PERIRECTAL ABSCESS: SHX1804

## 2018-01-22 LAB — CBC WITH DIFFERENTIAL/PLATELET
BASOS ABS: 0 10*3/uL (ref 0.0–0.1)
BASOS PCT: 0 %
EOS ABS: 0 10*3/uL (ref 0.0–0.7)
Eosinophils Relative: 0 %
HEMATOCRIT: 38.4 % (ref 36.0–46.0)
Hemoglobin: 12.5 g/dL (ref 12.0–15.0)
Lymphocytes Relative: 7 %
Lymphs Abs: 1.1 10*3/uL (ref 0.7–4.0)
MCH: 28.7 pg (ref 26.0–34.0)
MCHC: 32.6 g/dL (ref 30.0–36.0)
MCV: 88.1 fL (ref 78.0–100.0)
MONO ABS: 1.2 10*3/uL — AB (ref 0.1–1.0)
Monocytes Relative: 8 %
NEUTROS ABS: 12.7 10*3/uL — AB (ref 1.7–7.7)
Neutrophils Relative %: 85 %
Platelets: 347 10*3/uL (ref 150–400)
RBC: 4.36 MIL/uL (ref 3.87–5.11)
RDW: 13.6 % (ref 11.5–15.5)
WBC: 15 10*3/uL — ABNORMAL HIGH (ref 4.0–10.5)

## 2018-01-22 LAB — BASIC METABOLIC PANEL
ANION GAP: 11 (ref 5–15)
BUN: 10 mg/dL (ref 6–20)
CALCIUM: 9.2 mg/dL (ref 8.9–10.3)
CO2: 26 mmol/L (ref 22–32)
CREATININE: 0.75 mg/dL (ref 0.44–1.00)
Chloride: 103 mmol/L (ref 98–111)
GFR calc Af Amer: >60 mL/min (ref >60)
GFR calc non Af Amer: >60 mL/min (ref >60)
GLUCOSE: 108 mg/dL — AB (ref 70–99)
Potassium: 2.8 mmol/L — ABNORMAL LOW (ref 3.5–5.1)
Sodium: 140 mmol/L (ref 135–145)

## 2018-01-22 LAB — I-STAT CG4 LACTIC ACID, ED: LACTIC ACID, VENOUS: 1.16 mmol/L (ref 0.5–1.9)

## 2018-01-22 SURGERY — INCISION AND DRAINAGE, ABSCESS, PERIRECTAL
Anesthesia: General | Site: Perineum

## 2018-01-22 MED ORDER — IOPAMIDOL (ISOVUE-300) INJECTION 61%
INTRAVENOUS | Status: AC
  Filled 2018-01-22: qty 100

## 2018-01-22 MED ORDER — PROPOFOL 10 MG/ML IV BOLUS
INTRAVENOUS | Status: AC
  Filled 2018-01-22: qty 20

## 2018-01-22 MED ORDER — SODIUM CHLORIDE 0.9 % IV BOLUS
1000.0000 mL | Freq: Once | INTRAVENOUS | Status: AC
  Administered 2018-01-22: 1000 mL via INTRAVENOUS

## 2018-01-22 MED ORDER — VANCOMYCIN HCL 10 G IV SOLR
2250.0000 mg | INTRAVENOUS | Status: AC
  Administered 2018-01-22: 2250 mg via INTRAVENOUS
  Filled 2018-01-22: qty 2000

## 2018-01-22 MED ORDER — FENTANYL CITRATE (PF) 100 MCG/2ML IJ SOLN
INTRAMUSCULAR | Status: AC
  Filled 2018-01-22: qty 2

## 2018-01-22 MED ORDER — HYDROMORPHONE HCL 1 MG/ML IJ SOLN
0.2500 mg | INTRAMUSCULAR | Status: DC | PRN
  Administered 2018-01-23 (×3): 0.5 mg via INTRAVENOUS

## 2018-01-22 MED ORDER — LIDOCAINE 2% (20 MG/ML) 5 ML SYRINGE
INTRAMUSCULAR | Status: AC
  Filled 2018-01-22: qty 5

## 2018-01-22 MED ORDER — POTASSIUM CHLORIDE 10 MEQ/100ML IV SOLN
10.0000 meq | INTRAVENOUS | Status: DC
  Administered 2018-01-23: 10 meq via INTRAVENOUS
  Filled 2018-01-22: qty 100

## 2018-01-22 MED ORDER — LACTATED RINGERS IV SOLN
INTRAVENOUS | Status: DC
  Filled 2018-01-22 (×2): qty 1000

## 2018-01-22 MED ORDER — CLINDAMYCIN PHOSPHATE 900 MG/50ML IV SOLN
900.0000 mg | Freq: Once | INTRAVENOUS | Status: AC
  Administered 2018-01-22: 900 mg via INTRAVENOUS
  Filled 2018-01-22: qty 50

## 2018-01-22 MED ORDER — IOPAMIDOL (ISOVUE-300) INJECTION 61%
100.0000 mL | Freq: Once | INTRAVENOUS | Status: AC | PRN
  Administered 2018-01-22: 100 mL via INTRAVENOUS

## 2018-01-22 MED ORDER — SUCCINYLCHOLINE CHLORIDE 200 MG/10ML IV SOSY
PREFILLED_SYRINGE | INTRAVENOUS | Status: AC
  Filled 2018-01-22: qty 10

## 2018-01-22 MED ORDER — PIPERACILLIN-TAZOBACTAM 3.375 G IVPB 30 MIN
3.3750 g | Freq: Once | INTRAVENOUS | Status: AC
  Administered 2018-01-22: 3.375 g via INTRAVENOUS
  Filled 2018-01-22: qty 50

## 2018-01-22 SURGICAL SUPPLY — 20 items
COVER SURGICAL LIGHT HANDLE (MISCELLANEOUS) ×3 IMPLANT
ELECT REM PT RETURN 9FT ADLT (ELECTROSURGICAL) ×3
ELECTRODE REM PT RTRN 9FT ADLT (ELECTROSURGICAL) ×1 IMPLANT
GAUZE SPONGE 4X4 12PLY STRL (GAUZE/BANDAGES/DRESSINGS) ×3 IMPLANT
GLOVE BIO SURGEON STRL SZ 6 (GLOVE) ×3 IMPLANT
GLOVE BIOGEL PI IND STRL 6.5 (GLOVE) ×1 IMPLANT
GLOVE BIOGEL PI IND STRL 7.0 (GLOVE) ×1 IMPLANT
GLOVE BIOGEL PI INDICATOR 6.5 (GLOVE) ×2
GLOVE BIOGEL PI INDICATOR 7.0 (GLOVE) ×2
GLOVE INDICATOR 6.5 STRL GRN (GLOVE) ×3 IMPLANT
GOWN STRL REUS W/ TWL XL LVL3 (GOWN DISPOSABLE) ×3 IMPLANT
GOWN STRL REUS W/TWL 2XL LVL3 (GOWN DISPOSABLE) ×3 IMPLANT
GOWN STRL REUS W/TWL XL LVL3 (GOWN DISPOSABLE) ×6
HANDPIECE INTERPULSE COAX TIP (DISPOSABLE) ×2
KIT GASTRIC LAVAGE 34FR ADT (SET/KITS/TRAYS/PACK) IMPLANT
PACK BASIC (CUSTOM PROCEDURE TRAY) ×3 IMPLANT
PAD ABD 7.5X8 STRL (GAUZE/BANDAGES/DRESSINGS) ×3 IMPLANT
SET HNDPC FAN SPRY TIP SCT (DISPOSABLE) ×1 IMPLANT
TOWEL OR 17X26 10 PK STRL BLUE (TOWEL DISPOSABLE) ×3 IMPLANT
YANKAUER SUCT BULB TIP NO VENT (SUCTIONS) ×3 IMPLANT

## 2018-01-22 NOTE — ED Triage Notes (Signed)
Patient seen here yesterday, dx with bursitis. [facility] felt [facility], and did not get prescription. Today, pain became increasing worse so patient decided to come back. Appointment made with doctor for July 9th. Patient reports left sided back pain. Afebrile.   BP: 119/86 HR: 110 O2: 97% BGL 121 Temp 98.9 per patient.

## 2018-01-22 NOTE — ED Notes (Signed)
Patient reports today a "big black bubble" that formed on left buttock,  patient was able to place hot compress on site and site oozed pus and blood. Patient back today with increasing pain to site and states it is now red and hot.

## 2018-01-22 NOTE — Anesthesia Preprocedure Evaluation (Addendum)
Anesthesia Evaluation  Patient identified by MRN, date of birth, ID band Patient awake    Reviewed: Allergy & Precautions, NPO status , Patient's Chart, lab work & pertinent test results  Airway Mallampati: II  TM Distance: >3 FB     Dental   Pulmonary neg pulmonary ROS,    breath sounds clear to auscultation       Cardiovascular hypertension,  Rhythm:Regular Rate:Normal     Neuro/Psych    GI/Hepatic negative GI ROS, Neg liver ROS,   Endo/Other  negative endocrine ROS  Renal/GU negative Renal ROS     Musculoskeletal   Abdominal   Peds  Hematology   Anesthesia Other Findings   Reproductive/Obstetrics                            Anesthesia Physical Anesthesia Plan  ASA: III and emergent  Anesthesia Plan: General   Post-op Pain Management:    Induction: Intravenous, Rapid sequence and Cricoid pressure planned  PONV Risk Score and Plan: Ondansetron, Dexamethasone and Midazolam  Airway Management Planned: Oral ETT  Additional Equipment:   Intra-op Plan:   Post-operative Plan: Extubation in OR  Informed Consent: I have reviewed the patients History and Physical, chart, labs and discussed the procedure including the risks, benefits and alternatives for the proposed anesthesia with the patient or authorized representative who has indicated his/her understanding and acceptance.   Dental advisory given  Plan Discussed with: Anesthesiologist and CRNA  Anesthesia Plan Comments:         Anesthesia Quick Evaluation

## 2018-01-22 NOTE — Progress Notes (Signed)
A consult was received from an ED physician for Vancomycin per pharmacy dosing.  The patient's profile has been reviewed for ht/wt/allergies/indication/available labs.    A one time order has been placed for Vancomycin 2250mg  IV.  Further antibiotics/pharmacy consults should be ordered by admitting physician if indicated.                       Thank you, Jamse MeadGadhia, Barbra Miner M 01/22/2018  9:24 PM

## 2018-01-22 NOTE — ED Notes (Signed)
Patient has started taking clindamycin today, had to get scripts delivered today.

## 2018-01-22 NOTE — ED Provider Notes (Signed)
[facility]-EMERGENCY DEPT Provider Note   CSN: [id number] Arrival date & time: 01/22/18  1637     History   Chief Complaint Chief Complaint  Patient presents with  . Hip Pain  . Wound Check    HPI Wendy Hooper is a 52 y.o. female.  HPI Patient presents with left-sided buttock pain.  Going to ER for cellulitis versus ischial bursitis.  Has had 2 dose of clindamycin.  Reported bedside ultrasound done previously did not show fluid collection.  States that now it is more painful.  Started having chills.  States that there is now a black center and started to drain.  Status drained foul-smelling purulent material.  May be more erythematous and swollen now also. Past Medical History:  Diagnosis Date  . Dizziness   . Hypertension     Patient Active Problem List   Diagnosis Date Noted  . Perirectal abscess 01/22/2018    History reviewed. No pertinent surgical history.   OB History   None      Home Medications    Prior to Admission medications   Medication Sig Start Date End Date Taking? Authorizing Provider  clindamycin (CLEOCIN) 150 MG capsule Take 2 capsules (300 mg total) by mouth 4 (four) times daily for 7 days. 01/21/18 01/28/18 Yes Nanavati, Ankit, MD  Ibuprofen-Famotidine (DUEXIS) 800-26.6 MG TABS Take 1 tablet by mouth daily as needed (pain).   Yes [provider]    Family History Family History  Problem Relation Age of Onset  . Hypertension Mother   . Hyperlipidemia Mother   . Hyperlipidemia Father   . COPD Father     Social History Social History   Tobacco Use  . Smoking status: Never Smoker  . Smokeless tobacco: Never Used  Substance Use Topics  . Alcohol use: No  . Drug use: No     Allergies   Patient has no known allergies.   Review of Systems Review of Systems  Constitutional: Positive for chills. Negative for appetite change.  HENT: Negative for congestion.   Cardiovascular: Negative for chest pain.    Gastrointestinal: Negative for abdominal pain and diarrhea.  Genitourinary: Negative for flank pain.  Musculoskeletal: Negative for back pain.  Skin: Positive for wound.  Neurological: Negative for weakness.  Hematological: Negative for adenopathy.  Psychiatric/Behavioral: Negative for confusion.     Physical Exam Updated Vital Signs BP (!) 160/81   Pulse 100   Temp 98.3 F (36.8 C) (Oral)   Resp 16   Ht 5\' 4"  (1.626 m)   Wt 108.9 kg (240 lb)   SpO2 95%   BMI 41.20 kg/m   Physical Exam  Constitutional: She appears well-developed.  HENT:  Head: Normocephalic.  Eyes: Pupils are equal, round, and reactive to light.  Cardiovascular:  Tachycardia  Pulmonary/Chest: She has no wheezes. She has no rales.  Abdominal: There is no tenderness.  Musculoskeletal:  No extremity tenderness.  Neurological: She is alert.  Skin: Skin is warm.  Left buttock has large tender indurated area with central necrosis with some bubbling to palpation.  No frank drainage at this time.  Is approximately 15 cm across.  It is in the medial inferior buttock area.  Does not appear to involve rectum.  Psychiatric: She has a normal mood and affect.     ED Treatments / Results  Labs (all labs ordered are listed, but only abnormal results are displayed) Labs Reviewed  BASIC METABOLIC PANEL - Abnormal; Notable for the following components:  Result Value   Potassium 2.8 (*)    Glucose, Bld 108 (*)    All other components within normal limits  CBC WITH DIFFERENTIAL/PLATELET - Abnormal; Notable for the following components:   WBC 15.0 (*)    Neutro Abs 12.7 (*)    Monocytes Absolute 1.2 (*)    All other components within normal limits  I-STAT CG4 LACTIC ACID, ED    EKG None  Radiology Ct Pelvis W Contrast  Result Date: 01/22/2018 CLINICAL DATA:  52 year old female with increasing pelvic pain. Perianal/perineal abscess. EXAM: CT PELVIS WITH CONTRAST TECHNIQUE: Multidetector CT imaging of the  pelvis was performed using the standard protocol following the bolus administration of intravenous contrast. CONTRAST:  ISOVUE-300 IOPAMIDOL (ISOVUE-300) INJECTION 61% COMPARISON:  None. FINDINGS: Urinary Tract:  No abnormality visualized. Bowel:  Unremarkable visualized pelvic bowel loops. Vascular/Lymphatic: No pathologically enlarged lymph nodes. No significant vascular abnormality seen. Reproductive:  No mass or other significant abnormality Other: A 4 x 6.5 x 4 cm ill-defined collection/abscess in the LEFT perineal/perianal region with inflammation containing gas extending inferiorly along the MEDIAL LEFT buttock. Musculoskeletal: No suspicious bone lesions identified. IMPRESSION: 1. 4 x 6.5 x 4 cm ill-defined collection/abscess in the LEFT perineal/perianal region with gas containing inflammation extending inferiorly along the MEDIAL LEFT buttock. This is worrisome for necrotizing/gas-forming infection. 2. No other significant abnormalities. Electronically Signed   By: Harmon Pier M.D.   On: 01/22/2018 20:54    Procedures Procedures (including critical care time)  Medications Ordered in ED Medications  iopamidol (ISOVUE-300) 61 % injection (has no administration in time range)  iopamidol (ISOVUE-300) 61 % injection (has no administration in time range)  vancomycin (VANCOCIN) 2,250 mg in sodium chloride 0.9 % 500 mL IVPB (2,250 mg Intravenous New Bag/Given 01/22/18 2309)  potassium chloride 10 mEq in 100 mL IVPB ( Intravenous Automatically Held 01/23/18 0115)  lactated ringers 1,000 mL with potassium chloride 40 mEq infusion (has no administration in time range)  HYDROmorphone (DILAUDID) injection 0.25-0.5 mg (has no administration in time range)  sodium chloride 0.9 % bolus 1,000 mL (0 mLs Intravenous Stopped 01/22/18 2203)  clindamycin (CLEOCIN) IVPB 900 mg (0 mg Intravenous Stopped 01/22/18 1848)  iopamidol (ISOVUE-300) 61 % injection 100 mL (100 mLs Intravenous Contrast Given 01/22/18 2023)    piperacillin-tazobactam (ZOSYN) IVPB 3.375 g (0 g Intravenous Stopped 01/22/18 2310)     Initial Impression / Assessment and Plan / ED Course  I have reviewed the triage vital signs and the nursing notes.  Pertinent labs & imaging results that were available during my care of the patient were reviewed by me and considered in my medical decision making (see chart for details).     Patient with buttock wound.  White count elevated.  Has had chills.  CT scan done and showed gas-forming organism versus a necrotizing fasciitis.  Seen by Dr. Donell Beers from general surgery.  Will take to the OR.  Thinks likely not a necrotizing fasciitis.  CRITICAL CARE Performed by: Benjiman Core Total critical care time: 30 minutes Critical care time was exclusive of separately billable procedures and treating other patients. Critical care was necessary to treat or prevent imminent or life-threatening deterioration. Critical care was time spent personally by me on the following activities: development of treatment plan with patient and/or surrogate as well as nursing, discussions with consultants, evaluation of patient's response to treatment, examination of patient, obtaining history from patient or surrogate, ordering and performing treatments and interventions, ordering and review of laboratory  studies, ordering and review of radiographic studies, pulse oximetry and re-evaluation of patient's condition.   Final Clinical Impressions(s) / ED Diagnoses   Final diagnoses:  Perianal abscess    ED Discharge Orders    None       Benjiman CorePickering, Audry Kauzlarich, MD 01/23/18 [phone]

## 2018-01-22 NOTE — H&P (Signed)
Wendy Hooper is an 52 y.o. female.   Chief Complaint: perirectal pain HPI:  Pt is a 52 yo F who has had gluteal soreness for around 1 week.  It was very subtle at first.  She had pain with movement.  She was also having back pain as well.  She had just started a new job.  She was seeing a chiropractor and it was felt that possibly she may have some bursitis of one of her gluteal muscles.  She had a treatment.  She came to the ED yesterday and had bedside ultrasound consistent with cellulitis.  She was placed on clindamycin.  She returned today after having drainage and increased size of swollen area.    She thinks she may have had a "boil" in this area around 40 years ago, but nothing recently.  She has no know history of inflammatory bowel disease, but does complain of frequent diarrhea and multiple dietary intolerances.  She had 2 days of shaking chills and a sense of being unable to get warm that she attributed to possible menopause.    She tried warm soaks and warm compresses and this is what elicited drainage today.  The pain got better initially, but it sealed up and pain worsened.  She has no history of diabetes.    Past Medical History:  Diagnosis Date  . Dizziness   . Hypertension     No past surgical history on file.  Family History  Problem Relation Age of Onset  . Hypertension Mother   . Hyperlipidemia Mother   . Hyperlipidemia Father   . COPD Father    Social History:  reports that she has never smoked. She has never used smokeless tobacco. She reports that she does not drink alcohol or use drugs.  Allergies: No Known Allergies  Meds: Current Meds  Medication Sig  . clindamycin (CLEOCIN) 150 MG capsule Take 2 capsules (300 mg total) by mouth 4 (four) times daily for 7 days.  . Ibuprofen-Famotidine (DUEXIS) 800-26.6 MG TABS Take 1 tablet by mouth daily as needed (pain).     Results for orders placed or performed during the hospital encounter of 01/22/18 (from the  past 48 hour(s))  Basic metabolic panel     Status: Abnormal   Collection Time: 01/22/18  6:20 PM  Result Value Ref Range   Sodium 140 135 - 145 mmol/L   Potassium 2.8 (L) 3.5 - 5.1 mmol/L   Chloride 103 98 - 111 mmol/L    Comment: Please note change in reference range.   CO2 26 22 - 32 mmol/L   Glucose, Bld 108 (H) 70 - 99 mg/dL    Comment: Please note change in reference range.   BUN 10 6 - 20 mg/dL    Comment: Please note change in reference range.   Creatinine, Ser 0.75 0.44 - 1.00 mg/dL   Calcium 9.2 8.9 - 10.3 mg/dL   GFR calc non Af Amer >60 >60 mL/min   GFR calc Af Amer >60 >60 mL/min    Comment: (NOTE) The eGFR has been calculated using the CKD EPI equation. This calculation has not been validated in all clinical situations. eGFR's persistently <60 mL/min signify possible Chronic Kidney Disease.    Anion gap 11 5 - 15    Comment: Performed at [facility], [place] [facility] [address] [place], [place] [address]  CBC with Differential     Status: Abnormal   Collection Time: 01/22/18  6:20 PM  Result Value Ref  Range   WBC 15.0 (H) 4.0 - 10.5 K/uL   RBC 4.36 3.87 - 5.11 MIL/uL   Hemoglobin 12.5 12.0 - 15.0 g/dL   HCT 38.4 36.0 - 46.0 %   MCV 88.1 78.0 - 100.0 fL   MCH 28.7 26.0 - 34.0 pg   MCHC 32.6 30.0 - 36.0 g/dL   RDW 13.6 11.5 - 15.5 %   Platelets 347 150 - 400 K/uL   Neutrophils Relative % 85 %   Neutro Abs 12.7 (H) 1.7 - 7.7 K/uL   Lymphocytes Relative 7 %   Lymphs Abs 1.1 0.7 - 4.0 K/uL   Monocytes Relative 8 %   Monocytes Absolute 1.2 (H) 0.1 - 1.0 K/uL   Eosinophils Relative 0 %   Eosinophils Absolute 0.0 0.0 - 0.7 K/uL   Basophils Relative 0 %   Basophils Absolute 0.0 0.0 - 0.1 K/uL    Comment: Performed at [facility], [place] [address], [place],  [address]  I-Stat CG4 Lactic Acid, ED     Status: None   Collection Time: 01/22/18  6:29 PM  Result Value Ref Range   Lactic Acid, Venous 1.16 0.5 - 1.9 mmol/L   Ct Pelvis  W Contrast  Result Date: 01/22/2018 CLINICAL DATA:  52 year old female with increasing pelvic pain. Perianal/perineal abscess. EXAM: CT PELVIS WITH CONTRAST TECHNIQUE: Multidetector CT imaging of the pelvis was performed using the standard protocol following the bolus administration of intravenous contrast. CONTRAST:  138m ISOVUE-300 IOPAMIDOL (ISOVUE-300) INJECTION 61% COMPARISON:  None. FINDINGS: Urinary Tract:  No abnormality visualized. Bowel:  Unremarkable visualized pelvic bowel loops. Vascular/Lymphatic: No pathologically enlarged lymph nodes. No significant vascular abnormality seen. Reproductive:  No mass or other significant abnormality Other: A 4 x 6.5 x 4 cm ill-defined collection/abscess in the LEFT perineal/perianal region with inflammation containing gas extending inferiorly along the MEDIAL LEFT buttock. Musculoskeletal: No suspicious bone lesions identified. IMPRESSION: 1. 4 x 6.5 x 4 cm ill-defined collection/abscess in the LEFT perineal/perianal region with gas containing inflammation extending inferiorly along the MEDIAL LEFT buttock. This is worrisome for necrotizing/gas-forming infection. 2. No other significant abnormalities. Electronically Signed   By: JMargarette CanadaM.D.   On: 01/22/2018 20:54    Review of Systems  Constitutional: Positive for chills and fever.  HENT: Negative.   Eyes: Negative.   Respiratory: Negative.   Cardiovascular: Negative.   Gastrointestinal: Positive for diarrhea.       Gluteal pain  Musculoskeletal: Positive for back pain.  Skin: Negative.   Neurological: Negative.   Endo/Heme/Allergies: Negative.   Psychiatric/Behavioral: Negative.   All other systems reviewed and are negative.   Blood pressure (!) 160/81, pulse 100, temperature 98.3 F (36.8 C), temperature source Oral, resp. rate 16, height _0  (1.626 m), weight 108.9 kg (240 lb), SpO2 95 %. Physical Exam  Constitutional: She is oriented to person, place, and time. She appears  well-developed and well-nourished. She appears distressed (mild).  HENT:  Head: Normocephalic and atraumatic.  Mouth/Throat: Oropharynx is clear and moist.  Braces Face flushed  Eyes: Pupils are equal, round, and reactive to light. Conjunctivae are normal. Right eye exhibits no discharge. Left eye exhibits no discharge. No scleral icterus.  Neck: Neck supple. No tracheal deviation present. No thyromegaly present.  Cardiovascular: Normal rate, regular rhythm, normal heart sounds and intact distal pulses. Exam reveals no gallop and no friction rub.  No murmur heard. Respiratory: Effort normal and breath sounds normal. No respiratory distress. She exhibits no tenderness.  GI:  Soft. She exhibits no distension. There is no tenderness.  Genitourinary:     Genitourinary Comments: Large amount of cellulitis with central necrosis overlying left ischial tuberosity.  Musculoskeletal: She exhibits no edema, tenderness or deformity.  Neurological: She is alert and oriented to person, place, and time. Coordination normal.  Skin: Skin is warm and dry. No rash noted. She is not diaphoretic. No erythema. No pallor.  Psychiatric: She has a normal mood and affect. Her behavior is normal. Judgment and thought content normal.     Assessment/Plan Large perirectal abscess with gas forming organism. NOT consistent with necrotizing fasciitis based on exam or history. Overlying cellulitis  Has had some spontaneous drainage this AM, but that stopped after a short while.    Given magnitude of abscess and tachycardia, recommend proceeding with incision and drainage.   Pt understandably apprehensive about anesthesia as she has never had it before.   I discussed that I think risk of non operative therapy outweighs risk of general anesthesia.    I reviewed that I would debride the central eschar and pulse lavage the cavity.  I discussed that I may have to make extended cut in order to adequately drain abscess.  I  also discussed the risk of packing/dressing changes, and the risk of recurrent abscess.                   Stark Klein, MD 01/22/2018, 11:13 PM

## 2018-01-22 NOTE — ED Notes (Signed)
Bed: WA02 Expected date:  Expected time:  Means of arrival:  Comments: EMS-pain/bursitis

## 2018-01-22 NOTE — ED Notes (Signed)
Informed consent signed by pt. 

## 2018-01-23 ENCOUNTER — Emergency Department (HOSPITAL_COMMUNITY): Payer: 59 | Admitting: Anesthesiology

## 2018-01-23 ENCOUNTER — Encounter (HOSPITAL_COMMUNITY): Payer: Self-pay | Admitting: General Surgery

## 2018-01-23 DIAGNOSIS — Z825 Family history of asthma and other chronic lower respiratory diseases: Secondary | ICD-10-CM | POA: Diagnosis not present

## 2018-01-23 DIAGNOSIS — N39498 Other specified urinary incontinence: Secondary | ICD-10-CM | POA: Diagnosis present

## 2018-01-23 DIAGNOSIS — I96 Gangrene, not elsewhere classified: Secondary | ICD-10-CM | POA: Diagnosis present

## 2018-01-23 DIAGNOSIS — K610 Anal abscess: Secondary | ICD-10-CM | POA: Diagnosis present

## 2018-01-23 DIAGNOSIS — Z8249 Family history of ischemic heart disease and other diseases of the circulatory system: Secondary | ICD-10-CM | POA: Diagnosis not present

## 2018-01-23 DIAGNOSIS — Z8349 Family history of other endocrine, nutritional and metabolic diseases: Secondary | ICD-10-CM | POA: Diagnosis not present

## 2018-01-23 DIAGNOSIS — R000 Tachycardia, unspecified: Secondary | ICD-10-CM | POA: Diagnosis present

## 2018-01-23 DIAGNOSIS — K6139 Other ischiorectal abscess: Secondary | ICD-10-CM | POA: Diagnosis present

## 2018-01-23 DIAGNOSIS — I10 Essential (primary) hypertension: Secondary | ICD-10-CM | POA: Diagnosis present

## 2018-01-23 LAB — CBC
HEMATOCRIT: 36.5 % (ref 36.0–46.0)
HEMOGLOBIN: 11.8 g/dL — AB (ref 12.0–15.0)
MCH: 28.1 pg (ref 26.0–34.0)
MCHC: 32.3 g/dL (ref 30.0–36.0)
MCV: 86.9 fL (ref 78.0–100.0)
Platelets: 237 10*3/uL (ref 150–400)
RBC: 4.2 MIL/uL (ref 3.87–5.11)
RDW: 13.6 % (ref 11.5–15.5)
WBC: 12.7 10*3/uL — ABNORMAL HIGH (ref 4.0–10.5)

## 2018-01-23 LAB — BASIC METABOLIC PANEL
Anion gap: 12 (ref 5–15)
BUN: 8 mg/dL (ref 6–20)
CALCIUM: 8.5 mg/dL — AB (ref 8.9–10.3)
CHLORIDE: 104 mmol/L (ref 98–111)
CO2: 24 mmol/L (ref 22–32)
CREATININE: 0.7 mg/dL (ref 0.44–1.00)
GFR calc Af Amer: >60 mL/min (ref >60)
GFR calc non Af Amer: >60 mL/min (ref >60)
GLUCOSE: 111 mg/dL — AB (ref 70–99)
Potassium: 3.5 mmol/L (ref 3.5–5.1)
Sodium: 140 mmol/L (ref 135–145)

## 2018-01-23 MED ORDER — ONDANSETRON HCL 4 MG/2ML IJ SOLN
INTRAMUSCULAR | Status: DC | PRN
  Administered 2018-01-23: 4 mg via INTRAVENOUS

## 2018-01-23 MED ORDER — SIMETHICONE 80 MG PO CHEW: 40.0000 mg | CHEWABLE_TABLET | Freq: Four times a day (QID) | ORAL | Status: DC | PRN

## 2018-01-23 MED ORDER — DIPHENHYDRAMINE HCL 50 MG/ML IJ SOLN: 12.5000 mg | Freq: Four times a day (QID) | INTRAMUSCULAR | Status: DC | PRN

## 2018-01-23 MED ORDER — DAKINS (1/4 STRENGTH) 0.125 % EX SOLN
CUTANEOUS | Status: DC | PRN
  Administered 2018-01-23: 1

## 2018-01-23 MED ORDER — SACCHAROMYCES BOULARDII 250 MG PO CAPS
250.0000 mg | ORAL_CAPSULE | Freq: Two times a day (BID) | ORAL | Status: DC
  Administered 2018-01-23 – 2018-01-25 (×5): 250 mg via ORAL
  Filled 2018-01-23 (×5): qty 1

## 2018-01-23 MED ORDER — SODIUM CHLORIDE 0.9 % IR SOLN
Status: DC | PRN
  Administered 2018-01-23: 3000 mL

## 2018-01-23 MED ORDER — OXYCODONE HCL 5 MG PO TABS
5.0000 mg | ORAL_TABLET | ORAL | Status: DC | PRN
  Administered 2018-01-23: 10 mg via ORAL
  Filled 2018-01-23 (×2): qty 2

## 2018-01-23 MED ORDER — DAKINS (1/4 STRENGTH) 0.125 % EX SOLN
Freq: Once | CUTANEOUS | Status: DC
  Filled 2018-01-23: qty 473

## 2018-01-23 MED ORDER — PIPERACILLIN-TAZOBACTAM 3.375 G IVPB
3.3750 g | Freq: Three times a day (TID) | INTRAVENOUS | Status: DC
  Administered 2018-01-23 – 2018-01-25 (×7): 3.375 g via INTRAVENOUS
  Filled 2018-01-23 (×8): qty 50

## 2018-01-23 MED ORDER — POTASSIUM CHLORIDE CRYS ER 20 MEQ PO TBCR
40.0000 meq | EXTENDED_RELEASE_TABLET | Freq: Three times a day (TID) | ORAL | Status: AC
  Administered 2018-01-23 (×3): 40 meq via ORAL
  Filled 2018-01-23 (×3): qty 2

## 2018-01-23 MED ORDER — SUCCINYLCHOLINE CHLORIDE 200 MG/10ML IV SOSY
PREFILLED_SYRINGE | INTRAVENOUS | Status: DC | PRN
  Administered 2018-01-23: 120 mg via INTRAVENOUS

## 2018-01-23 MED ORDER — GABAPENTIN 300 MG PO CAPS
300.0000 mg | ORAL_CAPSULE | Freq: Two times a day (BID) | ORAL | Status: DC
  Administered 2018-01-23 – 2018-01-24 (×4): 300 mg via ORAL
  Filled 2018-01-23 (×5): qty 1

## 2018-01-23 MED ORDER — PSYLLIUM 95 % PO PACK
1.0000 | PACK | Freq: Every day | ORAL | Status: DC
  Filled 2018-01-23 (×3): qty 1

## 2018-01-23 MED ORDER — FENTANYL CITRATE (PF) 100 MCG/2ML IJ SOLN
INTRAMUSCULAR | Status: DC | PRN
  Administered 2018-01-23 (×2): 25 ug via INTRAVENOUS
  Administered 2018-01-23: 50 ug via INTRAVENOUS

## 2018-01-23 MED ORDER — LIDOCAINE 2% (20 MG/ML) 5 ML SYRINGE
INTRAMUSCULAR | Status: DC | PRN
  Administered 2018-01-23: 100 mg via INTRAVENOUS

## 2018-01-23 MED ORDER — MORPHINE SULFATE (PF) 2 MG/ML IV SOLN
2.0000 mg | INTRAVENOUS | Status: DC | PRN
Start: 2018-01-23 — End: 2018-01-25
  Filled 2018-01-23: qty 1

## 2018-01-23 MED ORDER — ACETAMINOPHEN 650 MG RE SUPP: 650.0000 mg | Freq: Four times a day (QID) | RECTAL | Status: DC | PRN

## 2018-01-23 MED ORDER — ONDANSETRON 4 MG PO TBDP: 4.0000 mg | ORAL_TABLET | Freq: Four times a day (QID) | ORAL | Status: DC | PRN

## 2018-01-23 MED ORDER — METOPROLOL TARTRATE 5 MG/5ML IV SOLN: 5.0000 mg | Freq: Four times a day (QID) | INTRAVENOUS | Status: DC | PRN

## 2018-01-23 MED ORDER — ACETAMINOPHEN 325 MG PO TABS: 650.0000 mg | ORAL_TABLET | Freq: Four times a day (QID) | ORAL | Status: DC | PRN

## 2018-01-23 MED ORDER — ZOLPIDEM TARTRATE 5 MG PO TABS: 5.0000 mg | ORAL_TABLET | Freq: Every evening | ORAL | Status: DC | PRN

## 2018-01-23 MED ORDER — SENNA 8.6 MG PO TABS
1.0000 | ORAL_TABLET | Freq: Two times a day (BID) | ORAL | Status: DC
  Administered 2018-01-24: 8.6 mg via ORAL
  Filled 2018-01-23 (×5): qty 1

## 2018-01-23 MED ORDER — 0.9 % SODIUM CHLORIDE (POUR BTL) OPTIME
TOPICAL | Status: DC | PRN
  Administered 2018-01-23: 1000 mL

## 2018-01-23 MED ORDER — DIPHENHYDRAMINE HCL 12.5 MG/5ML PO ELIX: 12.5000 mg | ORAL_SOLUTION | Freq: Four times a day (QID) | ORAL | Status: DC | PRN

## 2018-01-23 MED ORDER — HYDROMORPHONE HCL 1 MG/ML IJ SOLN
INTRAMUSCULAR | Status: AC
  Filled 2018-01-23: qty 2

## 2018-01-23 MED ORDER — ENOXAPARIN SODIUM 40 MG/0.4ML ~~LOC~~ SOLN
40.0000 mg | Freq: Every day | SUBCUTANEOUS | Status: DC
  Administered 2018-01-23: 40 mg via SUBCUTANEOUS
  Filled 2018-01-23 (×3): qty 0.4

## 2018-01-23 MED ORDER — PROPOFOL 10 MG/ML IV BOLUS
INTRAVENOUS | Status: DC | PRN
  Administered 2018-01-23: 180 mg via INTRAVENOUS

## 2018-01-23 MED ORDER — POTASSIUM CHLORIDE CRYS ER 20 MEQ PO TBCR
40.0000 meq | EXTENDED_RELEASE_TABLET | Freq: Every day | ORAL | Status: DC
  Administered 2018-01-24 – 2018-01-25 (×2): 40 meq via ORAL
  Filled 2018-01-23 (×2): qty 2

## 2018-01-23 MED ORDER — OXYCODONE HCL 5 MG PO TABS
ORAL_TABLET | ORAL | Status: AC
  Filled 2018-01-23: qty 2

## 2018-01-23 MED ORDER — TRAMADOL HCL 50 MG PO TABS
50.0000 mg | ORAL_TABLET | Freq: Four times a day (QID) | ORAL | Status: DC | PRN
  Administered 2018-01-23 – 2018-01-25 (×5): 50 mg via ORAL
  Filled 2018-01-23 (×5): qty 1

## 2018-01-23 MED ORDER — ACETAMINOPHEN 500 MG PO TABS
1000.0000 mg | ORAL_TABLET | Freq: Three times a day (TID) | ORAL | Status: DC
  Administered 2018-01-23 – 2018-01-25 (×7): 1000 mg via ORAL
  Filled 2018-01-23 (×7): qty 2

## 2018-01-23 MED ORDER — MORPHINE SULFATE (PF) 2 MG/ML IV SOLN
1.0000 mg | INTRAVENOUS | Status: DC | PRN
  Administered 2018-01-23 (×2): 2 mg via INTRAVENOUS
  Filled 2018-01-23 (×3): qty 1

## 2018-01-23 MED ORDER — HYDROMORPHONE HCL 1 MG/ML IJ SOLN
0.2500 mg | INTRAMUSCULAR | Status: DC | PRN
  Administered 2018-01-23: 0.5 mg via INTRAVENOUS

## 2018-01-23 MED ORDER — ONDANSETRON HCL 4 MG/2ML IJ SOLN
4.0000 mg | Freq: Four times a day (QID) | INTRAMUSCULAR | Status: DC | PRN
  Administered 2018-01-23: 4 mg via INTRAVENOUS
  Filled 2018-01-23: qty 2

## 2018-01-23 NOTE — Progress Notes (Signed)
Pharmacy Antibiotic Note  Wendy PereyraMargaret E Wolak is a 52 y.o. female admitted on 01/22/2018 with cellulitis.  Pharmacy has been consulted for zosyn dosing.  Plan: Zosyn 3.375g IV q8h (4 hour infusion).  Height: 5\' 4"  (162.6 cm) Weight: 240 lb (108.9 kg) IBW/kg (Calculated) : 54.7  Temp (24hrs), Avg:97.9 F (36.6 C), Min:97.4 F (36.3 C), Max:98.3 F (36.8 C)  Recent Labs  Lab 01/22/18 1820 01/22/18 1829  WBC 15.0*  --   CREATININE 0.75  --   LATICACIDVEN  --  1.16    Estimated Creatinine Clearance: 101.5 mL/min (by C-G formula based on SCr of 0.75 mg/dL).    No Known Allergies  Antimicrobials this admission: Vancomycin 01/22/2018 x1 Clindamycin 01/22/2018 x1 Zosyn 01/22/2018 >>   Dose adjustments this admission: -  Microbiology results: -  Thank you for allowing pharmacy to be a part of this patient's care.  Aleene DavidsonGrimsley Jr, Taras Rask Crowford 01/23/2018 2:24 AM

## 2018-01-23 NOTE — Op Note (Signed)
Incision and Drainage complex perirectal/perineal abscess, irrigation and debridement Procedure Note   Pre-operative Diagnosis: left sided perirectal/perineal abscess   Post-operative Diagnosis: same   Indications: Pt with large perirectal abscess, gas forming organism, tachycardia, leukocytosis  Anesthesia: General   Procedure Details  The procedure, risks and complications have been discussed in detail (including, infection, bleeding, need for additional procedures) with the patient, and the patient has signed consent to the procedure. The patient was informed that the wound would be left open.  The patient was taken to OR 1 and general anesthesia was induced. The patient was placed into lithotomy position.  The skin was sterilely prepped and draped over the affected area in the usual fashion. Time out was performed according to the surgical safety checklist.   I&D with a #11 blade was performed on the left gluteal region in the necrotic eschar.  Copious foul smelling pus was obtained.  Cultures were sent. There was dead tissue underlying the skin. The abscess went anteriorly to the mid rectum and the vagina.  It also went deep into the buttock.  Sepations were broken up digitally.  No apparent fistula was seen or appreciated on palpation.    Metzenbaum scissors were used to debride skin and soft tissue for a total of 3x3x2 cubic centimeters.   The pulse lavage was used to irrigate the cavity. The wound was packed with kerlix soaked in 1/4 strength Dakin's solution.    Due to spontaneous urination with coughing, an in and out catheterization was performed.  The patient was awakened from anesthesia and taken to the PACU in stable condition. Needle, sponge, and instrument counts were correct per protocol.    Findings:  Copious foul smelling pus with necrotic tissue underneath the skin. Tract   EBL: <25 mL cc's   Drains: None   Condition: Tolerated procedure well   Complications:  none  known.

## 2018-01-23 NOTE — Transfer of Care (Signed)
Immediate Anesthesia Transfer of Care Note  Patient: Wendy Hooper  Procedure(s) Performed: IRRIGATION AND DEBRIDEMENT PERIRECTAL ABSCESS (N/A Perineum)  Patient Location: PACU  Anesthesia Type:General  Level of Consciousness: awake, alert  and oriented  Airway & Oxygen Therapy: Patient Spontanous Breathing and Patient connected to face mask oxygen  Post-op Assessment: Report given to RN and Post -op Vital signs reviewed and stable  Post vital signs: Reviewed and stable  Last Vitals:  Vitals Value Taken Time  BP 110/72 01/23/2018  1:03 AM  Temp    Pulse 85 01/23/2018  1:05 AM  Resp 17 01/23/2018  1:05 AM  SpO2 98 % 01/23/2018  1:05 AM  Vitals shown include unvalidated device data.  Last Pain:  Vitals:   01/22/18 2113  TempSrc:   PainSc: 5          Complications: No apparent anesthesia complications

## 2018-01-23 NOTE — Anesthesia Procedure Notes (Signed)
Procedure Name: Intubation Date/Time: 01/23/2018 12:13 AM Performed by: Akeem Heppler D, CRNA Pre-anesthesia Checklist: Patient identified, Emergency Drugs available, Suction available and Patient being monitored Patient Re-evaluated:Patient Re-evaluated prior to induction Oxygen Delivery Method: Circle system utilized Preoxygenation: Pre-oxygenation with 100% oxygen Induction Type: IV induction and Rapid sequence Laryngoscope Size: Mac and 4 Grade View: Grade I Tube type: Oral Tube size: 7.5 mm Number of attempts: 1 Airway Equipment and Method: Stylet Placement Confirmation: ETT inserted through vocal cords under direct vision,  positive ETCO2 and breath sounds checked- equal and bilateral Secured at: 21 cm Tube secured with: Tape Dental Injury: Teeth and Oropharynx as per pre-operative assessment

## 2018-01-23 NOTE — Care Management Note (Signed)
Case Management Note  Patient Details  Name: Wendy Hooper MRN: [id number] Date of Birth: April 03, 1966  Subjective/Objective:                  Discharge [facility] for dsg changes  [facility]  Expected Discharge Date:  01/25/18               Expected Discharge Plan:  Home w Home Health Services  In-House Referral:     Discharge planning Services  CM Consult  Post Acute Care Choice:    Choice offered to:  NA  DME Arranged:    DME Agency:     HH Arranged:  RN HH Agency:  [facility]  Status of Service:  Completed, signed off  If discussed at Long Length of Stay Meetings, dates discussed:    Additional Comments:  [name], Rhonda Lynn, RN 01/23/2018, 10:40 AM

## 2018-01-23 NOTE — Progress Notes (Signed)
1 Day Post-Op    CC: Left gluteal pain  Subjective: Patient in bed, we took down her dressing and remove the packing.  Despite pain medications it was still pretty painful for her.  Most of the interior of the open wound was clean and beefy there is one area of necrosis still present.  See the picture below.  Patient reports she has a history of hypertension she has been on blood pressure medicines in the past but does not take them very often.  Objective: Vital signs in last 24 hours: Temp:  [97.4 F (36.3 C)-98.3 F (36.8 C)] 97.7 F (36.5 C) (07/03 0608) Pulse Rate:  [79-112] 79 (07/03 0608) Resp:  [14-19] 16 (07/03 0608) BP: (110-160)/(48-92) 114/67 (07/03 0608) SpO2:  [94 %-99 %] 97 % (07/03 0608) Weight:  [108.9 kg (240 lb)] 108.9 kg (240 lb) (07/02 1651)  120 PO IV 1100 Voided x 1 recorded Afebrile,VSS WBC is better, BMP OK CT of the pelvis on admit:4 x 6.5 x 4 cm ill-defined collection/abscess in the LEFT perineal/perianal region with gas containing inflammation extending inferiorly along the MEDIAL LEFT buttock. This is worrisome for necrotizing/gas-forming infection.  Intake/Output from previous day: 07/02 0701 - 07/03 0700 In: 1570 [P.O.:120; I.V.:400; IV Piggyback:1050] Out: -  Intake/Output this shift: No intake/output data recorded.  General appearance: alert, cooperative and no distress Resp: clear to auscultation bilaterally Skin: see picture below, this is fairly deep about 7-8 cm and very painful to remove the original packing.       Lab Results:  Recent Labs    01/22/18 1820 01/23/18 0600  WBC 15.0* 12.7*  HGB 12.5 11.8*  HCT 38.4 36.5  PLT 347 237    BMET Recent Labs    01/22/18 1820 01/23/18 0600  NA 140 140  K 2.8* 3.5  CL 103 104  CO2 26 24  GLUCOSE 108* 111*  BUN 10 8  CREATININE 0.75 0.70  CALCIUM 9.2 8.5*   PT/INR No results for input(s): LABPROT, INR in the last 72 hours.  No results for input(s): AST, ALT, ALKPHOS,  BILITOT, PROT, ALBUMIN in the last 168 hours.   Lipase  No results found for: LIPASE   Prior to Admission medications   Medication Sig Start Date End Date Taking? Authorizing Provider  clindamycin (CLEOCIN) 150 MG capsule Take 2 capsules (300 mg total) by mouth 4 (four) times daily for 7 days. 01/21/18 01/28/18 Yes Nanavati, Ankit, MD  Ibuprofen-Famotidine (DUEXIS) 800-26.6 MG TABS Take 1 tablet by mouth daily as needed (pain).   Yes [provider]    Medications: . enoxaparin (LOVENOX) injection  40 mg Subcutaneous Daily  . gabapentin  300 mg Oral BID  . HYDROmorphone      . oxyCODONE      . potassium chloride  40 mEq Oral TID  . senna  1 tablet Oral BID  . sodium hypochlorite   Irrigation Once   . piperacillin-tazobactam (ZOSYN)  IV 3.375 g (01/23/18 0518)    Assessment/Plan Hypertension  Large perirectal abscess, gas forming organism, tachycardia, leukocytosis Incision and drainage, irrigation and debridement of complex perirectal/perineal abscess, 01/23/2018, Dr. Almond Lint   FEN: IV fluids/regular diet ID: cleocin IV in ED x ; Zosyn 01/22/2018 =>> day 2; vancomycin 7/2 =>> day 2  DVT: Lovenox Follow-up: Dr. Donell Beers  Plan: We will start twice daily dressing changes.  Wet-to-dry with Kerlix.  Continue IV antibiotics.  Recheck CBC in the a.m. asked case manager to see if she can  assist with some home health, and assistance with this large rather deep wound.  Place her on fiber and probiotic.    LOS: 1 day    Aryanne Gilleland 01/23/2018 [phone]

## 2018-01-23 NOTE — Anesthesia Postprocedure Evaluation (Signed)
Anesthesia Post Note  Patient: Wendy PereyraMargaret E Hooper  Procedure(s) Performed: IRRIGATION AND DEBRIDEMENT PERIRECTAL ABSCESS (N/A Perineum)     Patient location during evaluation: PACU Anesthesia Type: General Level of consciousness: awake Pain management: pain level controlled Vital Signs Assessment: post-procedure vital signs reviewed and stable Respiratory status: spontaneous breathing Cardiovascular status: stable Anesthetic complications: no    Last Vitals:  Vitals:   01/22/18 1849 01/22/18 2113  BP: 134/73 (!) 160/81  Pulse: 99 100  Resp: 15 16  Temp:    SpO2: 95% 95%    Last Pain:  Vitals:   01/22/18 2113  TempSrc:   PainSc: 5                  Onyekachi Gathright

## 2018-01-24 LAB — CBC
HCT: 38.5 % (ref 36.0–46.0)
HEMOGLOBIN: 12.2 g/dL (ref 12.0–15.0)
MCH: 27.9 pg (ref 26.0–34.0)
MCHC: 31.7 g/dL (ref 30.0–36.0)
MCV: 87.9 fL (ref 78.0–100.0)
PLATELETS: 284 10*3/uL (ref 150–400)
RBC: 4.38 MIL/uL (ref 3.87–5.11)
RDW: 13.5 % (ref 11.5–15.5)
WBC: 7.6 10*3/uL (ref 4.0–10.5)

## 2018-01-24 LAB — BASIC METABOLIC PANEL
Anion gap: 8 (ref 5–15)
BUN: 9 mg/dL (ref 6–20)
CHLORIDE: 105 mmol/L (ref 98–111)
CO2: 27 mmol/L (ref 22–32)
Calcium: 9.1 mg/dL (ref 8.9–10.3)
Creatinine, Ser: 0.72 mg/dL (ref 0.44–1.00)
GFR calc Af Amer: >60 mL/min (ref >60)
Glucose, Bld: 83 mg/dL (ref 70–99)
Potassium: 4.2 mmol/L (ref 3.5–5.1)
SODIUM: 140 mmol/L (ref 135–145)

## 2018-01-24 NOTE — Progress Notes (Signed)
   General [facility]  Assessment & Plan: POD#1 status post I&D perineal abscess  IV abx's continue  Dressing changes twice daily  [facility]  Pain Rx        Velora Hecklerodd M. Ripken Rekowski, MD, [facility]       [facility]       Office: [phone]    Chief Complaint: Perineal abscess  Subjective: Patient more comfortable, pleasant.  Dressing change this AM not as uncomfortable.  Objective: Vital signs in last 24 hours: Temp:  [97.8 F (36.6 C)-98 F (36.7 C)] 97.8 F (36.6 C) (07/04 0523) Pulse Rate:  [69-81] 81 (07/04 0523) Resp:  [16-18] 17 (07/04 0523) BP: (126-136)/(69-84) 126/84 (07/04 0523) SpO2:  [96 %-100 %] 96 % (07/04 0523)    Intake/Output from previous day: 07/03 0701 - 07/04 0700 In: 396.3 [P.O.:120; IV Piggyback:276.3] Out: -  Intake/Output this shift: No intake/output data recorded.  Physical Exam: HEENT - sclerae clear, mucous membranes moist Neck - soft Perineum - gauze dressing dry and intact; no significant cellulitis noted Neuro - alert & oriented, no focal deficits  Lab Results:  Recent Labs    01/23/18 0600 01/24/18 0555  WBC 12.7* 7.6  HGB 11.8* 12.2  HCT 36.5 38.5  PLT 237 284   BMET Recent Labs    01/23/18 0600 01/24/18 0555  NA 140 140  K 3.5 4.2  CL 104 105  CO2 24 27  GLUCOSE 111* 83  BUN 8 9  CREATININE 0.70 0.72  CALCIUM 8.5* 9.1   PT/INR No results for input(s): LABPROT, INR in the last 72 hours. Comprehensive Metabolic Panel:    Component Value Date/Time   NA 140 01/24/2018 0555   NA 140 01/23/2018 0600   K 4.2 01/24/2018 0555   K 3.5 01/23/2018 0600   CL 105 01/24/2018 0555   CL 104 01/23/2018 0600   CO2 27 01/24/2018 0555   CO2 24 01/23/2018 0600   BUN 9 01/24/2018 0555   BUN 8 01/23/2018 0600   CREATININE 0.72 01/24/2018 0555   CREATININE 0.70 01/23/2018 0600   GLUCOSE 83 01/24/2018 0555   GLUCOSE 111 (H) 01/23/2018  0600   CALCIUM 9.1 01/24/2018 0555   CALCIUM 8.5 (L) 01/23/2018 0600    Studies/Results: Ct Pelvis W Contrast  Result Date: 01/22/2018 CLINICAL DATA:  52 year old female with increasing pelvic pain. Perianal/perineal abscess. EXAM: CT PELVIS WITH CONTRAST TECHNIQUE: Multidetector CT imaging of the pelvis was performed using the standard protocol following the bolus administration of intravenous contrast. CONTRAST:  100mL ISOVUE-300 IOPAMIDOL (ISOVUE-300) INJECTION 61% COMPARISON:  None. FINDINGS: Urinary Tract:  No abnormality visualized. Bowel:  Unremarkable visualized pelvic bowel loops. Vascular/Lymphatic: No pathologically enlarged lymph nodes. No significant vascular abnormality seen. Reproductive:  No mass or other significant abnormality Other: A 4 x 6.5 x 4 cm ill-defined collection/abscess in the LEFT perineal/perianal region with inflammation containing gas extending inferiorly along the MEDIAL LEFT buttock. Musculoskeletal: No suspicious bone lesions identified. IMPRESSION: 1. 4 x 6.5 x 4 cm ill-defined collection/abscess in the LEFT perineal/perianal region with gas containing inflammation extending inferiorly along the MEDIAL LEFT buttock. This is worrisome for necrotizing/gas-forming infection. 2. No other significant abnormalities. Electronically Signed   By: Harmon PierJeffrey  Hu M.D.   On: 01/22/2018 20:54      Davi Kroon M 01/24/2018  Patient ID: Wendy Hooper, female   DOB: 12/27/1965, 52 y.o.   MRN: [id number]

## 2018-01-25 MED ORDER — ACETAMINOPHEN 500 MG PO TABS: ORAL_TABLET | ORAL | 0 refills | Status: DC

## 2018-01-25 MED ORDER — PSYLLIUM 95 % PO PACK: PACK | ORAL | Status: DC

## 2018-01-25 MED ORDER — AMOXICILLIN-POT CLAVULANATE 875-125 MG PO TABS
1.0000 | ORAL_TABLET | Freq: Two times a day (BID) | ORAL | Status: DC
  Administered 2018-01-25 (×2): 1 via ORAL
  Filled 2018-01-25 (×2): qty 1

## 2018-01-25 MED ORDER — AMOXICILLIN-POT CLAVULANATE 875-125 MG PO TABS: 1.0000 | ORAL_TABLET | Freq: Two times a day (BID) | ORAL | 0 refills | Status: DC

## 2018-01-25 MED ORDER — SACCHAROMYCES BOULARDII 250 MG PO CAPS: ORAL_CAPSULE | ORAL | Status: DC

## 2018-01-25 MED ORDER — IBUPROFEN 200 MG PO TABS: ORAL_TABLET | ORAL | Status: AC

## 2018-01-25 MED ORDER — TRAMADOL HCL 50 MG PO TABS: 50.0000 mg | ORAL_TABLET | Freq: Four times a day (QID) | ORAL | 0 refills | Status: DC | PRN

## 2018-01-25 NOTE — Discharge Instructions (Signed)
Perianal Abscess An abscess is an infected area that is filled with pus. A perianal abscess occurs in the perineum, which is the area between the anus and the scrotum in males and between the anus and the vagina in females. Perianal abscesses can vary in size. Without treatment, a perianal abscess can become larger and cause other problems. What are the causes? This condition is caused by:  Waste from damaged or dead tissue (debris) that plugs up glands in the perineum. When this happens, an abscess may form.  Infections of the perineum.  What are the signs or symptoms? Common symptoms of this condition include:  Swelling and redness in the area of the abscess. The redness may go beyond the abscess and appear as a red streak on the skin.  Pain in the area of the abscess, including pain when sitting, walking, or passing stool.  Other possible symptoms include:  A visible, painful lump, or a lump that can be felt when touched.  Bleeding or pus-like discharge from the area.  Fever.  General weakness.  How is this diagnosed? This condition is diagnosed based on your medical history and a physical exam of the affected area.  This may involve examining the rectal area with a gloved hand (digital rectal exam).  Sometimes, the health care provider needs to look into the rectum using a probe or a scope.  For women, it may require a careful vaginal exam.  How is this treated? Treatment for this condition may include:  Making a cut (incision) in the abscess to drain the pus. This can sometimes be done in your health care provider's office or an emergency department after you are given medicine to numb the area (local anesthetic).  Surgery to drain the abscess. This is for larger or deeper abscesses.  Antibiotic medicines, if there is infection in the surrounding tissue (cellulitis).  Having gauze packed into the abscess to continue draining the area.  Frequent baths in warm water  that is deep enough to cover your hips and buttocks (sitz baths). These help the wound heal and they make the abscess less likely to come back.  Follow these instructions at home: Medicines  Take over-the-counter and prescription medicines for pain, fever, or discomfort only as told by your health care provider.  If you were prescribed an antibiotic medicine, use it as told by your health care provider. Do not stop using the antibiotic even if you start to feel better.  Do not drive or use heavy machinery while taking prescription pain medicine. Wound care   Keep the skin around the wound clean and dry. Avoid cleaning the area too much.  Avoid scratching the wound.  Avoid using colored or perfumed toilet papers.  Take a sitz bath 3-4 times a day and after bowel movements. This will help reduce pain and swelling.  If directed, apply ice to the injured area: ? Put ice in a plastic bag. ? Place a towel between your skin and the bag. ? Leave the ice on for 20 minutes, 2-3 times a day.  Check your incision area every day for signs of infection. Check for: ? More redness, swelling, or pain. ? More fluid or blood. ? Warmth. ? Pus or a bad smell. Gauze  If gauze was used in the abscess, follow instructions from your health care provider about removing or changing the gauze. It can usually be removed in 2-3 days.  Wash your hands with soap and water before you remove or  change your gauze. If soap and water are not available, use hand sanitizer.  If one or more drains were placed in the abscess cavity, be careful not to pull at them. Your health care provider will tell you how long they need to remain in place. General instructions  Keep all follow-up visits as told by your health care provider. This is important. Contact a health care provider if:  You have trouble passing stool or passing urine.  Your pain or swelling in the affected area does not seem to be getting  better.  The gauze packing or the drains come out before the planned time. Get help right away if:  You have problems moving or using your legs.  You have severe or increasing pain.  Your swelling in the affected area suddenly gets worse.  You have a large increase in bleeding or passing of pus.  You have chills or a fever. This information is not intended to replace advice given to you by your health care provider. Make sure you discuss any questions you have with your health care provider. Document Released: 08/16/2006 Document Revised: 01/28/2016 Document Reviewed: 12/20/2015 Elsevier Interactive Patient Education  2018 [employer]   Mechanical Wound Debridement You can shower and sit in the tube to soak this area twice a day.  You can also do this, if you have soiling after a bowel movement.  Repack this area with wet Kerlix.  Wet with sterile saline and do dressing changes twice a day.   Mechanical wound debridement is a treatment to remove dead tissue from a wound. This helps the wound heal. The treatment involves cleaning the wound (irrigation) and using a pad or gauze (dressing) to remove dead tissue and debris from the wound. There are different types of mechanical wound debridement. Depending on the wound, you may need to repeat this procedure or change to another form of debridement as your wound starts to heal. Tell a health care provider about:  Any allergies you have.  All medicines you are taking, including vitamins, herbs, eye drops, creams, and over-the-counter medicines.  Any blood disorders you have.  Any medical conditions you have, including any conditions that: ? Cause a significant decrease in blood circulation to the part of the body where the wound is, such as peripheral vascular disease. ? Compromise your defense (immune) system or white blood count.  Any surgeries you have had.  Whether you are pregnant or may be pregnant. What are the  risks? Generally, this is a safe procedure. However, problems may occur, including:  Infection.  Bleeding.  Damage to healthy tissue in and around your wound.  Soreness or pain.  Failure of the wound to heal.  Scarring.  What happens before the procedure? You may be given antibiotic medicine to help prevent infection. What happens during the procedure?  Your health care provider may apply a numbing medicine (topical anesthetic) to the wound.  Your health care provider will irrigate your wound with a germ-free (sterile), salt-water (saline) solution. This removes debris, bacteria, and dead tissue.  Depending on what type of mechanical wound debridement you are having, your health care provider may do one of the following: ? Put a dressing on your wound. You may have dry gauze pad placed into the wound. Your health care provider will remove the gauze after the wound is dry. Any dead tissue and debris that has dried into the gauze will be lifted out of the wound (wet-to-dry debridement). ? Use a  type of pad (monofilament fiber debridement pad). This pad has a fluffy surface on one side that picks up dead tissue and debris from your wound. Your health care provider wets the pad and wipes it over your wound for several minutes. ? Irrigate your wound with a pressurized stream of solution such as saline or water.  Once your health care provider is finished, he or she may apply a light dressing to your wound. The procedure may vary among health care providers and hospitals. What happens after the procedure?  You may receive medicine for pain.  You will continue to receive antibiotic medicine if it was started before your procedure. This information is not intended to replace advice given to you by your health care provider. Make sure you discuss any questions you have with your health care provider. Document Released: 03/31/2015 Document Revised: 12/16/2015 Document Reviewed:  11/18/2014 Elsevier Interactive Patient Education  2018 [employer].  GETTING TO GOOD BOWEL HEALTH. Irregular bowel habits such as constipation and diarrhea can lead to many problems over time.  Having one soft bowel movement a day is the most important way to prevent further problems.  The anorectal canal is designed to handle stretching and feces to safely manage our ability to get rid of solid waste (feces, poop, stool) out of our body.  BUT, hard constipated stools can act like ripping concrete bricks and diarrhea can be a burning fire to this very sensitive area of our body, causing inflamed hemorrhoids, anal fissures, increasing risk is perirectal abscesses, abdominal pain/bloating, an making irritable bowel worse.     The goal: ONE SOFT BOWEL MOVEMENT A DAY!  To have soft, regular bowel movements:   Drink at least 8 tall glasses of water a day.    Take plenty of fiber.  Fiber is the undigested part of plant food that passes into the colon, acting s natures broom to encourage bowel motility and movement.  Fiber can absorb and hold large amounts of water. This results in a larger, bulkier stool, which is soft and easier to pass. Work gradually over several weeks up to 6 servings a day of fiber (25g a day even more if needed) in the form of: o Vegetables -- Root (potatoes, carrots, turnips), leafy green (lettuce, salad greens, celery, spinach), or cooked high residue (cabbage, broccoli, etc) o Fruit -- Fresh (unpeeled skin & pulp), Dried (prunes, apricots, cherries, etc ),  or stewed ( applesauce)  o Whole grain breads, pasta, etc (whole wheat)  o Bran cereals   Bulking Agents -- This type of water-retaining fiber generally is easily obtained each day by one of the following:  o Psyllium bran -- The psyllium plant is remarkable because its ground seeds can retain so much water. This product is available as Metamucil, Konsyl, Effersyllium, Per Diem Fiber, or the less expensive generic  preparation in drug and health food stores. Although labeled a laxative, it really is not a laxative.  o Methylcellulose -- This is another fiber derived from wood which also retains water. It is available as Citrucel. o Polyethylene Glycol - and artificial fiber commonly called Miralax or Glycolax.  It is helpful for people with gassy or bloated feelings with regular fiber o Flax Seed - a less gassy fiber than psyllium  No reading or other relaxing activity while on the toilet. If bowel movements take longer than 5 minutes, you are too constipated  AVOID CONSTIPATION.  High fiber and water intake usually takes care of this.  Sometimes a laxative is needed to stimulate more frequent bowel movements, but   Laxatives are not a good long-term solution as it can wear the colon out. o Osmotics (Milk of Magnesia, Fleets phosphosoda, Magnesium citrate, MiraLax, GoLytely) are safer than  o Stimulants (Senokot, Castor Oil, Dulcolax, Ex Lax)    o Do not take laxatives for more than 7days in a row.   IF SEVERELY CONSTIPATED, try a Bowel Retraining Program: o Do not use laxatives.  o Eat a diet high in roughage, such as bran cereals and leafy vegetables.  o Drink six (6) ounces of prune or apricot juice each morning.  o Eat two (2) large servings of stewed fruit each day.  o Take one (1) heaping tablespoon of a psyllium-based bulking agent twice a day. Use sugar-free sweetener when possible to avoid excessive calories.  o Eat a normal breakfast.  o Set aside 15 minutes after breakfast to sit on the toilet, but do not strain to have a bowel movement.  o If you do not have a bowel movement by the third day, use an enema and repeat the above steps.   Controlling diarrhea o Switch to liquids and simpler foods for a few days to avoid stressing your intestines further. o Avoid dairy products (especially milk & ice cream) for a short time.  The intestines often can lose the ability to digest lactose when  stressed. o Avoid foods that cause gassiness or bloating.  Typical foods include beans and other legumes, cabbage, broccoli, and dairy foods.  Every person has some sensitivity to other foods, so listen to our body and avoid those foods that trigger problems for you. o Adding fiber (Citrucel, Metamucil, psyllium, Miralax) gradually can help thicken stools by absorbing excess fluid and retrain the intestines to act more normally.  Slowly increase the dose over a few weeks.  Too much fiber too soon can backfire and cause cramping & bloating. o Probiotics (such as active yogurt, Align, etc) may help repopulate the intestines and colon with normal bacteria and calm down a sensitive digestive tract.  Most studies show it to be of mild help, though, and such products can be costly. o Medicines: - Bismuth subsalicylate (ex. Kayopectate, Pepto Bismol) every 30 minutes for up to 6 doses can help control diarrhea.  Avoid if pregnant. - Loperamide (Immodium) can slow down diarrhea.  Start with two tablets (4mg  total) first and then try one tablet every 6 hours.  Avoid if you are having fevers or severe pain.  If you are not better or start feeling worse, stop all medicines and call your doctor for advice o Call your doctor if you are getting worse or not better.  Sometimes further testing (cultures, endoscopy, X-ray studies, bloodwork, etc) may be needed to help diagnose and treat the cause of the diarrhea.

## 2018-01-25 NOTE — Care Management Note (Addendum)
Case Management Note  Patient Details  Name: Wendy Hooper MRN: [id number] Date of Birth: September 03, 1965  Subjective/Objective:                  Per Clydie BraunKaren with [facility] has insurance through [employer] can not take pateint/patient is attempting to get her cigna id number/tct-[employer] can not do claim without the id number/number received from patient and [facility] call with information and orders faxed to center. rec'd call back from [employer] insurance under the number given patient termed in 2018. TCF-Patient new id number and group number obtained/ tct-[facility]/information for home dsg changes given. [facility] has denied based on this. 1600- call rec'd from [facility], denies coverage due to no active coverage, explained to the caqll what has taken place and the coverage is active but to no avail. 1700-[facility] is unable to do. Action/Plan: Will call [employer] asap after getting the members id number expected from the member within several hours per [employer] No isurance-patient has found two people to help with the wound care. tct-[name] with [facility] to run has either chairty or private pay. tct-Will Marlyne BeardsJennings updated on progress and instructed to write out dsg change orders in the home health care order. Information and orders faxed successfully to [facility] at [phone]. 1610-call the [facility] will not take due to staffing. tct-[facility] to see if they will do self and teaching of wound to the people who are going to be helping the patient at home.  wcb.  1705-spoke with patient she has a friend who is an Charity fundraiserN who is willing to come and be trained on how to the packing and dressing change. Patient understANDS to keep call [employer] to get insurance verification on Monday.  There are four documented call to [facility] about the needs of an RN to do the dressing changes. Expected Discharge Date:  01/25/18               Expected Discharge Plan:  Home w Home Health  Services  In-House Referral:     Discharge planning Services  CM Consult  Post Acute Care Choice:    Choice offered to:  NA  DME Arranged:    DME Agency:     HH Arranged:  RN HH Agency:  [facility]  Status of Service:  Completed, signed off  If discussed at Long Length of Stay Meetings, dates discussed:    Additional Comments:  [name], [name], RN 01/25/2018, 11:07 AM

## 2018-01-25 NOTE — Progress Notes (Signed)
3 Days Post-Op    CC:  Perirectal abscess  Subjective: Pt is adamant about going home today, unfortunately she does not have anyone to help and we are working to get her some help.  Home health will not go out unless there is someone who they can teach.  I called the [facility] and they do not do wound dressing changes and there is no consult appointments for the next 3 weeks.  She still has an open site that is about 8 cm deep, with a good deal of serous drainage from it.    Objective: Vital signs in last 24 hours: Temp:  [98 F (36.7 C)-98.3 F (36.8 C)] 98.3 F (36.8 C) (07/05 0551) Pulse Rate:  [76-95] 76 (07/05 0551) Resp:  [18] 18 (07/05 0551) BP: (133-150)/(77-90) 150/90 (07/05 0551) SpO2:  [100 %] 100 % (07/05 0551)    Specimen Description PERIRECTAL   Special Requests ABSCESS  [facility], [address] [place], [place] [address]     Gram Stain MODERATE WBC PRESENT, PREDOMINANTLY PMN  ABUNDANT GRAM NEGATIVE RODS  ABUNDANT GRAM NEGATIVE COCCOBACILLI  ABUNDANT GRAM POSITIVE COCCI     I/O =??? Afebrile, VSS Labs are good yesterday   Intake/Output from previous day: 07/04 0701 - 07/05 0700 In: 69.2 [IV Piggyback:69.2] Out: -  Intake/Output this shift: No intake/output data recorded.  General appearance: alert, cooperative and no distress Resp: clear to auscultation bilaterally Skin: cellulitis is better, still a deep wound that goes a good 8 cm with applicatior stick.  She has a lot of serous fluid draining fromt he site also.    Lab Results:  Recent Labs    01/23/18 0600 01/24/18 0555  WBC 12.7* 7.6  HGB 11.8* 12.2  HCT 36.5 38.5  PLT 237 284    BMET Recent Labs    01/23/18 0600 01/24/18 0555  NA 140 140  K 3.5 4.2  CL 104 105  CO2 24 27  GLUCOSE 111* 83  BUN 8 9  CREATININE 0.70 0.72  CALCIUM 8.5* 9.1   PT/INR No results for input(s): LABPROT, INR in the last 72 hours.  No results for input(s): AST,  ALT, ALKPHOS, BILITOT, PROT, ALBUMIN in the last 168 hours.   Lipase  No results found for: LIPASE   Medications: . acetaminophen  1,000 mg Oral Q8H  . enoxaparin (LOVENOX) injection  40 mg Subcutaneous Daily  . gabapentin  300 mg Oral BID  . potassium chloride  40 mEq Oral Daily  . psyllium  1 packet Oral Daily  . saccharomyces boulardii  250 mg Oral BID  . senna  1 tablet Oral BID  . sodium hypochlorite   Irrigation Once    Assessment/Plan Hypertension  Large perirectal abscess, gas forming organism, tachycardia, leukocytosis Incision and drainage, irrigation and debridement of complex perirectal/perineal abscess, 01/23/2018, Dr. Almond LintFaera Byerly  FEN: IV fluids/regular diet ID: cleocin IV in ED x ; Zosyn 01/22/2018 =>> day 4; vancomycin 7/2 => 1 dose Convert Zosyn to Augmentin for 5 more days DVT: Lovenox Follow-up: Dr. Ala DachByerly/next week in the Office with DOW.           LOS: 3 days    Sonna Lipsky 01/25/2018 [phone]

## 2018-01-25 NOTE — Progress Notes (Signed)
Patient given discharge instructions, and verbalized an understanding of all discharge instructions.  Patient agrees with discharge plan, and is being discharged in stable medical condition.  Patient given transportation via wheelchair.  Patient given supplies for dressing changes, and dressing change instructions given to friend.  Patient's friend a Engineer, civil (consulting)nurse for 30 years, and was confident that she would be able to do dressing.

## 2018-01-28 LAB — ANAEROBIC CULTURE

## 2018-01-28 NOTE — Discharge Summary (Addendum)
Physician Discharge Summary  Patient ID: Wendy Hooper MRN: [id number] DOB/AGE: Mar 31, 1966 52 y.o.  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 Marcina Millard Course:   Pt is a 52 yo F who has had gluteal soreness for around 1 week.  It was very subtle at first.  She had pain with movement.  She was also having back pain as well.  She had just started a new job.  She was seeing a chiropractor and it was felt that possibly she may have some bursitis of one of her gluteal muscles.  She had a treatment.  She came to the ED yesterday and had bedside ultrasound consistent with cellulitis.  She was placed on clindamycin.  She returned today after having drainage and increased size of swollen area.    She thinks she may have had a "boil" in this area around 40 years ago, but nothing recently.  She has no know history of inflammatory bowel disease, but does complain of frequent diarrhea and multiple dietary intolerances.  She had 2 days of shaking chills and a sense of being unable to get warm that she attributed to possible menopause.    She tried warm soaks and warm compresses and this is what elicited drainage today.  The pain got better initially, but it sealed up and pain worsened.  She has no history of diabetes.    Patient was seen in the ED by Dr. Donell Beers.  Patient had a large perirectal abscess with gas-forming at organisms tachycardia, and leukocytosis.  She was taken directly to the operating room that a.m.  She underwent incision and drainage of complex wound.  There was copious bowels smelling pus with necrosis in the tissue underneath the  skin that tracked distally parallel to the rectum.  This was thoroughly drained it was packed with Kerlix and she was transferred to the floor.  Postop she had a fair amount of difficulty with the dressing changes.  It was extremely painful and in a very difficult place to dress.  There is no way the patient could do this without additional assistance.  Unfortunately she just moved to the area and she had no friends or family in the area.  There is no one for her to call on.  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  We placed her on wet-to-dry dressings twice daily with continued the IV antibiotics while she was here.  She improved with dressing changes over the next couple days.  Her leukocytosis improved.  The biggest sticking point was getting assistance to help her.  She was ultimately taken as a charity case while her insurance issues were being worked out.  Home health was ultimately arranged.  We discussed care at home.  And she was discharged home on 01/25/2018.  Case managers worked extremely hard to set up outpatient care for her.  We have also arranged follow-up the following week in our clinic.  Condition on D/C:  Improved  CBC Latest Ref Rng & Units 01/24/2018 01/23/2018 01/22/2018  WBC 4.0 - 10.5 K/uL 7.6 12.7(H) 15.0(H)  Hemoglobin 12.0 - 15.0 g/dL 40.9 11.8(L) 12.5  Hematocrit 36.0 - 46.0 % 38.5  36.5 38.4  Platelets 150 - 400 K/uL 284 237 347   CMP Latest Ref Rng & Units 01/24/2018 01/23/2018 01/22/2018  Glucose 70 - 99 mg/dL 83 272(Z111(H) 366(Y108(H)  BUN 6 - 20 mg/dL 9 8 10   Creatinine 0.44 - 1.00 mg/dL 4.030.72 4.740.70 2.590.75  Sodium 135 - 145 mmol/L 140 140 140  Potassium 3.5 - 5.1 mmol/L 4.2 3.5 2.8(L)  Chloride 98 - 111 mmol/L 105 104 103  CO2 22 - 32 mmol/L 27 24 26   Calcium 8.9 - 10.3 mg/dL 9.1 5.6(L8.5(L) 9.2    Condition on discharge: Improved.  Disposition:  Home with home health to address dressing changes.   Allergies as of 01/25/2018   No Known Allergies      Medication List    STOP taking these medications   clindamycin 150 MG capsule Commonly known as:  CLEOCIN   DUEXIS 800-26.6 MG Tabs Generic drug:  Ibuprofen-Famotidine     TAKE these medications   acetaminophen 500 MG tablet Commonly known as:  TYLENOL You can take 2 tablets every 6 hours as needed for pain.  Use this first.  You can buy this over the counter at any drug store.  Do not exceed 4000 mg of Tylenol per day, it can harm your liver.   amoxicillin-clavulanate 875-125 MG tablet Commonly known as:  AUGMENTIN Take 1 tablet by mouth every 12 (twelve) hours.   ibuprofen 200 MG tablet Commonly known as:  ADVIL,MOTRIN You can take 2-3 tablets every 6 hours as needed for pain.  This should be your second choice for pain control.  You can buy this over the counter at any drug store.   psyllium 95 % Pack Commonly known as:  HYDROCIL/METAMUCIL You want to have 1-2 soft Bowel movements per day.  This is your first line drug for this.  It does not work overnight, it is something you have to take daily.  I will let you decide if and how much you need, most people start with 1 scoop per day.You can buy this over the counter at any drug store.  Follow package instructions.   saccharomyces boulardii 250 MG capsule Commonly known as:  FLORASTOR You can buy this over the counter at any drug store.  It will be in the same area as the Metamucil/Citurcel products.  I would use this for the next couple weeks while you are on antibiotics.  This helps restore the good bacteria we kill with antibiotics. Follow package directions.   traMADol 50 MG tablet Commonly known as:  ULTRAM Take 1 tablet (50 mg total) by mouth every 6 (six) hours as needed (Pain not relieved by Tylenol or ibuporfen).      Follow-up Information    Almond LintByerly, Faera, MD Follow up.   Specialty:  General Surgery Contact information: [address] [place] [address] [phone]        [facility] [place]  [place] Follow up on 01/29/2018.   Specialty:  General Surgery Why:  your appointment is at 3:30 PM. Be at the office 30 minutes early for check in, bring photo ID and insurance information.  Ask our PA Puja about follow up with her or Dr. Donell BeersByerly.   Contact information: [address] [place] [address] [phone]           Signed: Sherrie GeorgeJENNINGS,Vaida Kerchner 01/28/2018, 1:22 PM

## 2018-02-06 ENCOUNTER — Encounter (HOSPITAL_COMMUNITY): Payer: Self-pay | Admitting: Emergency Medicine

## 2018-02-06 ENCOUNTER — Emergency Department (HOSPITAL_COMMUNITY)
Admission: EM | Admit: 2018-02-06 | Discharge: 2018-02-06 | Disposition: A | Discharge status: Home / Self Care | Payer: 59 | Attending: Emergency Medicine | Admitting: Emergency Medicine

## 2018-02-06 ENCOUNTER — Emergency Department (HOSPITAL_BASED_OUTPATIENT_CLINIC_OR_DEPARTMENT_OTHER)
Admit: 2018-02-06 | Discharge: 2018-02-06 | Disposition: A | Discharge status: Home / Self Care | Payer: 59 | Attending: Emergency Medicine | Admitting: Emergency Medicine

## 2018-02-06 DIAGNOSIS — Z9889 Other specified postprocedural states: Secondary | ICD-10-CM | POA: Insufficient documentation

## 2018-02-06 DIAGNOSIS — R42 Dizziness and giddiness: Secondary | ICD-10-CM | POA: Insufficient documentation

## 2018-02-06 DIAGNOSIS — R200 Anesthesia of skin: Secondary | ICD-10-CM | POA: Insufficient documentation

## 2018-02-06 DIAGNOSIS — I10 Essential (primary) hypertension: Secondary | ICD-10-CM | POA: Insufficient documentation

## 2018-02-06 DIAGNOSIS — R609 Edema, unspecified: Secondary | ICD-10-CM

## 2018-02-06 DIAGNOSIS — R202 Paresthesia of skin: Secondary | ICD-10-CM | POA: Diagnosis not present

## 2018-02-06 NOTE — ED Notes (Signed)
Bed: WTR8 Expected date:  Expected time:  Means of arrival:  Comments: 

## 2018-02-06 NOTE — ED Provider Notes (Signed)
[facility]-EMERGENCY DEPT Provider Note   CSN: [id number] Arrival date & time: 02/06/18  1158     History   Chief Complaint Chief Complaint  Patient presents with  . Leg Pain    HPI Wendy Hooper is a 52 y.o. female with history of hypertension, dizziness, recent hospitalization for perirectal abscess on the left side who presents with numbness and tingling to her right anterior lateral thigh.  She reports she has had episodes of shooting pain from the area, but none at this time.  She denies any back pain.  She does report she was laying on that side for a total of about 7 to 8 days.  She denies any chest pain, shortness of breath, numbness or tingling anywhere else.  Patient reports good healing of her surgical site and has an appointment in 2 days with the surgeon.  HPI  Past Medical History:  Diagnosis Date  . Dizziness   . Hypertension     Patient Active Problem List   Diagnosis Date Noted  . 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 INCISION AND DRAINAGE PERIRECTAL ABSCESS N/A 01/22/2018   Procedure: IRRIGATION AND DEBRIDEMENT PERIRECTAL ABSCESS;  Surgeon: Almond Lint, MD;  Location: [facility];  Service: General;  Laterality: N/A;     OB History   None      Home Medications    Prior to Admission medications   Medication Sig Start Date End Date Taking? Authorizing Provider  acetaminophen (TYLENOL) 500 MG tablet You can take 2 tablets every 6 hours as needed for pain.  Use this first.  You can buy this over the counter at any drug store.  Do not exceed 4000 mg of Tylenol per day, it can harm your liver. 01/25/18   Sherrie George, PA-C  amoxicillin-clavulanate (AUGMENTIN) 875-125 MG tablet Take 1 tablet by mouth every 12 (twelve) hours. 01/25/18   Sherrie George, PA-C  ibuprofen (ADVIL,MOTRIN) 200 MG tablet You can take 2-3 tablets every 6 hours as needed for  pain.  This should be your second choice for pain control.  You can buy this over the counter at any drug store. 01/25/18   Sherrie George, PA-C  psyllium (HYDROCIL/METAMUCIL) 95 % PACK You want to have 1-2 soft Bowel movements per day.  This is your first line drug for this.  It does not work overnight, it is something you have to take daily.  I will let you decide if and how much you need, most people start with 1 scoop per day.You can buy this over the counter at any drug store.  Follow package instructions. 01/25/18   Sherrie George, PA-C  saccharomyces boulardii (FLORASTOR) 250 MG capsule You can buy this over the counter at any drug store.  It will be in the same area as the Metamucil/Citurcel products.  I would use this for the next couple weeks while you are on antibiotics.  This helps restore the good bacteria we kill with antibiotics. Follow package directions. 01/25/18   Sherrie George, PA-C  traMADol (ULTRAM) 50 MG tablet Take 1 tablet (50 mg total) by mouth every 6 (six) hours as needed (Pain not relieved by Tylenol or ibuporfen). 01/25/18   Sherrie George, PA-C    Family History Family History  Problem Relation Age of Onset  . Hypertension Mother   . Hyperlipidemia Mother   . Hyperlipidemia Father   . COPD Father  Social History Social History   Tobacco Use  . Smoking status: Never Smoker  . Smokeless tobacco: Never Used  Substance Use Topics  . Alcohol use: No  . Drug use: No     Allergies   Patient has no known allergies.   Review of Systems Review of Systems  Constitutional: Negative for fever.  Skin: Positive for wound.  Neurological: Positive for numbness.     Physical Exam Updated Vital Signs BP (!) 147/79 (BP Location: Left Arm)   Pulse 91   Temp 98 F (36.7 C) (Oral)   Resp 14   SpO2 98%   Physical Exam  Constitutional: She appears well-developed and well-nourished. No distress.  HENT:  Head: Normocephalic and atraumatic.  Mouth/Throat:  Oropharynx is clear and moist. No oropharyngeal exudate.  Eyes: Pupils are equal, round, and reactive to light. Conjunctivae are normal. Right eye exhibits no discharge. Left eye exhibits no discharge. No scleral icterus.  Neck: Normal range of motion. Neck supple. No thyromegaly present.  Cardiovascular: Normal rate, regular rhythm, normal heart sounds and intact distal pulses. Exam reveals no gallop and no friction rub.  No murmur heard. Pulmonary/Chest: Effort normal and breath sounds normal. No stridor. No respiratory distress. She has no wheezes. She has no rales.  Abdominal: Soft. Bowel sounds are normal. She exhibits no distension. There is no tenderness. There is no rebound and no guarding.  Musculoskeletal: She exhibits no edema.       Legs: Lymphadenopathy:    She has no cervical adenopathy.  Neurological: She is alert. Coordination normal.  Skin: Skin is warm and dry. No rash noted. She is not diaphoretic. No pallor.  Psychiatric: She has a normal mood and affect.  Nursing note and vitals reviewed.    ED Treatments / Results  Labs (all labs ordered are listed, but only abnormal results are displayed) Labs Reviewed - No data to display  EKG None  Radiology No results found.  Procedures Procedures (including critical care time)  Medications Ordered in ED Medications - No data to display   Initial Impression / Assessment and Plan / ED Course  I have reviewed the triage vital signs and the nursing notes.  Pertinent labs & imaging results that were available during my care of the patient were reviewed by me and considered in my medical decision making (see chart for details).     Patient with area of paresthesia, most likely a neuralgia from pressure laying on the area.  DVT ruled out today with ultrasound.  Advised watchful waiting and ice as needed for pain and swelling.  Follow-up to surgeon.  No back pain, saddle anesthesia, bladder incontinence.  Return  precautions discussed.  Patient understands and agrees with plan.  Patient vitals stable throughout ED course and discharged in satisfactory condition. I discussed patient case with Dr. Freida BusmanAllen who guided the patient's management and agrees with plan.   Final Clinical Impressions(s) / ED Diagnoses   Final diagnoses:  Numbness and tingling of right leg    ED Discharge Orders    None       Emi HolesLaw, Rosselyn Martha M, PA-C 02/06/18 1432    Lorre NickAllen, Anthony, MD 02/06/18 1559

## 2018-02-06 NOTE — Progress Notes (Signed)
Right lower extremity venous duplex has been completed. Negative for DVT. Results were given to Buel ReamAlexandra Law PA.   02/06/18 1:42 PM Olen CordialGreg Hasaan Radde RVT

## 2018-02-06 NOTE — ED Triage Notes (Signed)
Patient here from home with complaints of right leg pain and tingling. States that she has been seen three times this week for same. Ambulatory.

## 2018-02-06 NOTE — Discharge Instructions (Signed)
Please follow-up with your doctor as planned.  Please return the emergency department if you develop any new or worsening symptoms.

## 2018-02-06 NOTE — ED Notes (Signed)
Discharge instructions reviewed with patient patient discharged to home

## 2019-01-27 ENCOUNTER — Emergency Department (HOSPITAL_COMMUNITY): Payer: Managed Care, Other (non HMO)

## 2019-01-27 ENCOUNTER — Encounter (HOSPITAL_COMMUNITY): Payer: Self-pay

## 2019-01-27 ENCOUNTER — Emergency Department (HOSPITAL_COMMUNITY)
Admission: EM | Admit: 2019-01-27 | Discharge: 2019-01-28 | Disposition: A | Discharge status: Home / Self Care | Payer: Managed Care, Other (non HMO) | Attending: Emergency Medicine | Admitting: Emergency Medicine

## 2019-01-27 ENCOUNTER — Other Ambulatory Visit: Payer: Self-pay

## 2019-01-27 DIAGNOSIS — R002 Palpitations: Secondary | ICD-10-CM | POA: Insufficient documentation

## 2019-01-27 DIAGNOSIS — Z5321 Procedure and treatment not carried out due to patient leaving prior to being seen by health care provider: Secondary | ICD-10-CM | POA: Diagnosis not present

## 2019-01-27 LAB — BASIC METABOLIC PANEL
Anion gap: 11 (ref 5–15)
BUN: 16 mg/dL (ref 6–20)
CO2: 24 mmol/L (ref 22–32)
Calcium: 9.5 mg/dL (ref 8.9–10.3)
Chloride: 104 mmol/L (ref 98–111)
Creatinine, Ser: 0.86 mg/dL (ref 0.44–1.00)
GFR calc Af Amer: >60 mL/min (ref >60)
GFR calc non Af Amer: >60 mL/min (ref >60)
Glucose, Bld: 140 mg/dL — ABNORMAL HIGH (ref 70–99)
Potassium: 4.5 mmol/L (ref 3.5–5.1)
Sodium: 139 mmol/L (ref 135–145)

## 2019-01-27 LAB — CBC
HCT: 42.1 % (ref 36.0–46.0)
Hemoglobin: 13.5 g/dL (ref 12.0–15.0)
MCH: 28.4 pg (ref 26.0–34.0)
MCHC: 32.1 g/dL (ref 30.0–36.0)
MCV: 88.4 fL (ref 80.0–100.0)
Platelets: 275 10*3/uL (ref 150–400)
RBC: 4.76 MIL/uL (ref 3.87–5.11)
RDW: 13.2 % (ref 11.5–15.5)
WBC: 8.7 10*3/uL (ref 4.0–10.5)
nRBC: 0 % (ref 0.0–0.2)

## 2019-01-27 LAB — TROPONIN I (HIGH SENSITIVITY): Troponin I (High Sensitivity): <2 ng/L (ref <18)

## 2019-01-27 LAB — I-STAT BETA HCG BLOOD, ED (MC, WL, AP ONLY): I-stat hCG, quantitative: <5 m[IU]/mL (ref <5)

## 2019-01-27 NOTE — ED Triage Notes (Signed)
t arrives POV for eval of palpitation episodes x 2-3 days. Pt states that she has been experiencing these episodes 1-2 x a day for about 30 minutes/piece for the last few days. Pt denies known hx of cardiac problem

## 2019-01-28 NOTE — ED Notes (Signed)
Per Annamary Carolin, RN pt not in waiting room

## 2019-12-18 ENCOUNTER — Ambulatory Visit (HOSPITAL_COMMUNITY)
Admission: EM | Admit: 2019-12-18 | Discharge: 2019-12-18 | Disposition: A | Discharge status: Home / Self Care | Payer: Managed Care, Other (non HMO)

## 2019-12-18 ENCOUNTER — Encounter (HOSPITAL_COMMUNITY): Payer: Self-pay

## 2019-12-18 ENCOUNTER — Other Ambulatory Visit: Payer: Self-pay

## 2019-12-18 DIAGNOSIS — G43109 Migraine with aura, not intractable, without status migrainosus: Secondary | ICD-10-CM

## 2019-12-18 LAB — CBG MONITORING, ED: Glucose-Capillary: 99 mg/dL (ref 70–99)

## 2019-12-18 NOTE — Discharge Instructions (Addendum)
We believe this may have been an occular migraine.   I would like for you to follow up with ophthalmology for more indepth eye exam, call the attached clinic  If you have sudden loss of vision, eye pain  please go to the emergency department for further evaluation.

## 2019-12-18 NOTE — ED Triage Notes (Signed)
Pt reports 1 hrs ago approx, she was looking at the computer and started seeing "zig zag" on the screen that last x 5 min. Pt denies headaches.

## 2019-12-18 NOTE — ED Provider Notes (Signed)
[facility]    CSN: [id number] Arrival date & time: 12/18/19  1449      History   Chief Complaint Chief Complaint  Patient presents with  . Blurred Vision    HPI Wendy Hooper is a 54 y.o. female.   Patient presents for episode of visual changes earlier today at work.  She reports she was working at her computer when her vision started to have squiggly lines in it.  She reports a circle with squiggly lines in it this lasted for about 5 minutes.  She reports closing 1 eye and still saying it and then covering the other eye and still saying it.  She also reports closing both eyes and still noticing it.  Denies any pain associated with this.  Denies headache with this.  Denies any other symptoms occurring with this.  No red eye recently.  No other vision changes.  She reports after the 5 minutes her vision completely restored.  She reports she did not lose vision during this only noticed the squiggly lines.  She reports she looks at screens often for work.  Unsure of whether she rubbed her eye significantly prior to this.  She reports she is just concerned whether this could be related to recent sinus infection or whether she could be having a stroke.  Denies  numbness, tingling or weakness.     Past Medical History:  Diagnosis Date  . Dizziness   . Hypertension     Patient Active Problem List   Diagnosis Date Noted  . 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 INCISION AND DRAINAGE PERIRECTAL ABSCESS N/A 01/22/2018   Procedure: IRRIGATION AND DEBRIDEMENT PERIRECTAL ABSCESS;  Surgeon: Almond Lint, MD;  Location: [facility];  Service: General;  Laterality: N/A;    OB History   No obstetric history on file.      Home Medications    Prior to Admission medications   Medication Sig Start Date End Date Taking? Authorizing Provider  azithromycin (ZITHROMAX) 250 MG tablet Take by mouth.  12/17/19  Yes [provider]  losartan (COZAAR) 50 MG tablet Take by mouth. 09/18/19  Yes [provider]  propranolol (INDERAL) 20 MG tablet Take by mouth. 10/08/19  Yes [provider]  acetaminophen (TYLENOL) 500 MG tablet You can take 2 tablets every 6 hours as needed for pain.  Use this first.  You can buy this over the counter at any drug store.  Do not exceed 4000 mg of Tylenol per day, it can harm your liver. 01/25/18   Sherrie George, PA-C  amoxicillin-clavulanate (AUGMENTIN) 875-125 MG tablet Take 1 tablet by mouth every 12 (twelve) hours. 01/25/18   Sherrie George, PA-C  ibuprofen (ADVIL,MOTRIN) 200 MG tablet You can take 2-3 tablets every 6 hours as needed for pain.  This should be your second choice for pain control.  You can buy this over the counter at any drug store. 01/25/18   Sherrie George, PA-C  psyllium (HYDROCIL/METAMUCIL) 95 % PACK You want to have 1-2 soft Bowel movements per day.  This is your first line drug for this.  It does not work overnight, it is something you have to take daily.  I will let you decide if and how much you need, most people start with 1 scoop per day.You can buy this over the counter at any drug store.  Follow package instructions. 01/25/18   Sherrie George,  PA-C  saccharomyces boulardii (FLORASTOR) 250 MG capsule You can buy this over the counter at any drug store.  It will be in the same area as the Metamucil/Citurcel products.  I would use this for the next couple weeks while you are on antibiotics.  This helps restore the good bacteria we kill with antibiotics. Follow package directions. 01/25/18   Earnstine Regal, PA-C  traMADol (ULTRAM) 50 MG tablet Take 1 tablet (50 mg total) by mouth every 6 (six) hours as needed (Pain not relieved by Tylenol or ibuporfen). 01/25/18   Earnstine Regal, PA-C    Family History Family History  Problem Relation Age of Onset  . Hypertension Mother   . Hyperlipidemia Mother   .  Hyperlipidemia Father   . COPD Father     Social History Social History   Tobacco Use  . Smoking status: Never Smoker  . Smokeless tobacco: Never Used  Substance Use Topics  . Alcohol use: No  . Drug use: No     Allergies   Patient has no known allergies.   Review of Systems Review of Systems   Physical Exam Triage Vital Signs ED Triage Vitals  Enc Vitals Group     BP 12/18/19 1528 (!) 133/97     Pulse Rate 12/18/19 1528 83     Resp 12/18/19 1528 19     Temp 12/18/19 1528 98.2 F (36.8 C)     Temp Source 12/18/19 1528 Oral     SpO2 12/18/19 1528 97 %     Weight --      Height --      Head Circumference --      Peak Flow --      Pain Score 12/18/19 1527 0     Pain Loc --      Pain Edu? --      Excl. in [place]? --    No data found.  Updated Vital Signs BP (!) 133/97 (BP Location: Right Arm)   Pulse 83   Temp 98.2 F (36.8 C) (Oral)   Resp 19   LMP 10/04/2016 (Approximate) Comment: denies pregnancy  SpO2 97%   Visual Acuity Right Eye Distance: 20/40(Without correction. ) Left Eye Distance: 20/50(Without correction. ) Bilateral Distance: 20/40(Without correction. )  Right Eye Near:   Left Eye Near:    Bilateral Near:     Physical Exam Vitals and nursing note reviewed.  HENT:     Head: Normocephalic and atraumatic.  Eyes:     General: Lids are normal. Vision grossly intact. No visual field deficit.       Right eye: No foreign body, discharge or hordeolum.        Left eye: No foreign body, discharge or hordeolum.     Extraocular Movements:     Right eye: Normal extraocular motion.     Left eye: Normal extraocular motion.     Conjunctiva/sclera:     Right eye: Right conjunctiva is not injected. No chemosis, exudate or hemorrhage.    Left eye: Left conjunctiva is not injected. No chemosis, exudate or hemorrhage.    Comments: Pupils equal and reactive.  Neurological:     Mental Status: She is alert.      UC Treatments / Results  Labs (all labs  ordered are listed, but only abnormal results are displayed) Labs Reviewed  CBG MONITORING, ED    EKG   Radiology No results found.  Procedures Procedures (including critical care time)  Medications Ordered in UC Medications -  No data to display  Initial Impression / Assessment and Plan / UC Course  I have reviewed the triage vital signs and the nursing notes.  Pertinent labs & imaging results that were available during my care of the patient were reviewed by me and considered in my medical decision making (see chart for details).     #Ocular migraine Patient is a 54 year old presenting with symptoms consistent with an ocular migraine.  Case discussed with attending physician Dr. Tracie Harrier and he believes this is likely which she has had.  Given no persistent symptoms and no true loss of vision, doubt retinal pathology.  No pain and no exposures for scratches so doubt corneal abrasions.  Given complete resolution of symptoms do not feel there is needed further work-up today.  Did recommend patient follow-up with ophthalmology to ensure no further ocular pathology.  Strict emergency department return precautions were discussed.  Patient verbalized understanding  Final Clinical Impressions(s) / UC Diagnoses   Final diagnoses:  Ocular migraine     Discharge Instructions     We believe this may have been an occular migraine.   I would like for you to follow up with ophthalmology for more indepth eye exam, call the attached clinic  If you have sudden loss of vision, eye pain  please go to the emergency department for further evaluation.    ED Prescriptions    None     PDMP not reviewed this encounter.   Hermelinda Medicus, PA-C 12/18/19 1611

## 2020-02-17 ENCOUNTER — Other Ambulatory Visit: Payer: Self-pay

## 2020-02-17 ENCOUNTER — Encounter (HOSPITAL_COMMUNITY): Payer: Self-pay | Admitting: Emergency Medicine

## 2020-02-17 ENCOUNTER — Ambulatory Visit (HOSPITAL_COMMUNITY)
Admission: EM | Admit: 2020-02-17 | Discharge: 2020-02-17 | Disposition: A | Discharge status: Home / Self Care | Payer: 59 | Attending: Physician Assistant | Admitting: Physician Assistant

## 2020-02-17 DIAGNOSIS — L304 Erythema intertrigo: Secondary | ICD-10-CM | POA: Diagnosis not present

## 2020-02-17 MED ORDER — CLOTRIMAZOLE-BETAMETHASONE 1-0.05 % EX CREA: 1.0000 "application " | TOPICAL_CREAM | Freq: Two times a day (BID) | CUTANEOUS | 0 refills | Status: AC

## 2020-02-17 NOTE — ED Triage Notes (Signed)
History of perianal abscess with cellulitis 2 years ago  Sore when sitting for long periods. Reports redness over tailbone area.

## 2020-02-17 NOTE — ED Provider Notes (Signed)
[facility]    CSN: [id number] Arrival date & time: 02/17/20  0844      History   Chief Complaint Chief Complaint  Patient presents with  . Rash    HPI Wendy Hooper is a 54 y.o. female.   Patient presents for concern of infection above her buttocks.  She states she has a history of having bad infection in this area 2019.  She reports she has noticed some redness between her buttocks and the upper portion over the last few days.  She does report some discomfort with sitting.  Denies discharge.  Denies fever or chills.     Past Medical History:  Diagnosis Date  . Dizziness   . Hypertension     Patient Active Problem List   Diagnosis Date Noted  . 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 INCISION AND DRAINAGE PERIRECTAL ABSCESS N/A 01/22/2018   Procedure: IRRIGATION AND DEBRIDEMENT PERIRECTAL ABSCESS;  Surgeon: Almond Lint, MD;  Location: [facility];  Service: General;  Laterality: N/A;    OB History   No obstetric history on file.      Home Medications    Prior to Admission medications   Medication Sig Start Date End Date Taking? Authorizing Provider  losartan (COZAAR) 50 MG tablet Take by mouth. 09/18/19  Yes [provider]  propranolol (INDERAL) 20 MG tablet Take by mouth. 10/08/19  Yes [provider]  saccharomyces boulardii (FLORASTOR) 250 MG capsule You can buy this over the counter at any drug store.  It will be in the same area as the Metamucil/Citurcel products.  I would use this for the next couple weeks while you are on antibiotics.  This helps restore the good bacteria we kill with antibiotics. Follow package directions. 01/25/18  Yes Sherrie George, PA-C  acetaminophen (TYLENOL) 500 MG tablet You can take 2 tablets every 6 hours as needed for pain.  Use this first.  You can buy this over the counter at any drug store.  Do not exceed 4000 mg of  Tylenol per day, it can harm your liver. 01/25/18   Sherrie George, PA-C  amoxicillin-clavulanate (AUGMENTIN) 875-125 MG tablet Take 1 tablet by mouth every 12 (twelve) hours. 01/25/18   Sherrie George, PA-C  azithromycin [facility]) 250 MG tablet Take by mouth. 12/17/19   [provider]  clotrimazole-betamethasone (LOTRISONE) cream Apply 1 application topically 2 (two) times daily for 7 days. 02/17/20 02/24/20  Kerrie Timm, Veryl Speak, PA-C  ibuprofen (ADVIL,MOTRIN) 200 MG tablet You can take 2-3 tablets every 6 hours as needed for pain.  This should be your second choice for pain control.  You can buy this over the counter at any drug store. 01/25/18   Sherrie George, PA-C  psyllium (HYDROCIL/METAMUCIL) 95 % PACK You want to have 1-2 soft Bowel movements per day.  This is your first line drug for this.  It does not work overnight, it is something you have to take daily.  I will let you decide if and how much you need, most people start with 1 scoop per day.You can buy this over the counter at any drug store.  Follow package instructions. 01/25/18   Sherrie George, PA-C  traMADol (ULTRAM) 50 MG tablet Take 1 tablet (50 mg total) by mouth every 6 (six) hours as needed (Pain not relieved by Tylenol or ibuporfen). 01/25/18   Sherrie George, PA-C    Family History Family History  Problem Relation Age of Onset  . Hypertension Mother   . Hyperlipidemia Mother   . Hyperlipidemia Father   . COPD Father     Social History Social History   Tobacco Use  . Smoking status: Never Smoker  . Smokeless tobacco: Never Used  Vaping Use  . Vaping Use: Never used  Substance Use Topics  . Alcohol use: No  . Drug use: No     Allergies   Augmentin [amoxicillin-pot clavulanate] and Claritin [loratadine]   Review of Systems Review of Systems   Physical Exam Triage Vital Signs ED Triage Vitals [02/17/20 0943]  Enc Vitals Group     BP (!) 151/105     Pulse Rate 87     Resp 16     Temp 98.3 F  (36.8 C)     Temp Source Oral     SpO2 98 %     Weight      Height      Head Circumference      Peak Flow      Pain Score      Pain Loc      Pain Edu?      Excl. in GC?    No data found.  Updated Vital Signs BP (!) 151/105   Pulse 87   Temp 98.3 F (36.8 C) (Oral)   Resp 16   LMP 10/04/2016 (Approximate) Comment: denies pregnancy  SpO2 98%   Visual Acuity Right Eye Distance:   Left Eye Distance:   Bilateral Distance:    Right Eye Near:   Left Eye Near:    Bilateral Near:     Physical Exam Vitals and nursing note reviewed.  Constitutional:      Appearance: Normal appearance.  Genitourinary:    Comments: intertriginous rash between buttocks from 2-3 inches superior of rectum to just below gluteal cleft, some papular/satelitle lesions present. Non-ttp.  No discharge Neurological:     Mental Status: She is alert.      UC Treatments / Results  Labs (all labs ordered are listed, but only abnormal results are displayed) Labs Reviewed - No data to display  EKG   Radiology No results found.  Procedures Procedures (including critical care time)  Medications Ordered in UC Medications - No data to display  Initial Impression / Assessment and Plan / UC Course  I have reviewed the triage vital signs and the nursing notes.  Pertinent labs & imaging results that were available during my care of the patient were reviewed by me and considered in my medical decision making (see chart for details).     #Intertrigo Patient is a 54 year old presenting with what appears to be intertrigo of the lower gluteal cleft and upper buttocks.  Appears more fungal in nature.  Will try Lotrisone cream.  Discussed return precautions if not improving or worsening.  Patient follow with her primary care.  Patient verbalized understanding plan of care Final Clinical Impressions(s) / UC Diagnoses   Final diagnoses:  Intertrigo     Discharge Instructions     I think this is  likely a fungal infection with skin irritation  Apply the cream 2 times a day for 7-10 days - follow up with PCP for check in about 1 week  If significantly worsening over next few days, return or follow up with your PCP      ED Prescriptions    Medication Sig Dispense Auth. Provider   clotrimazole-betamethasone (LOTRISONE) cream Apply 1 application topically 2 (two)  times daily for 7 days. 30 g Turhan Chill, Veryl Speak, PA-C     PDMP not reviewed this encounter.   Hermelinda Medicus, PA-C 02/17/20 2158

## 2020-02-17 NOTE — Discharge Instructions (Signed)
I think this is likely a fungal infection with skin irritation  Apply the cream 2 times a day for 7-10 days - follow up with PCP for check in about 1 week  If significantly worsening over next few days, return or follow up with your PCP

## 2020-04-10 ENCOUNTER — Other Ambulatory Visit: Payer: 59

## 2021-04-29 ENCOUNTER — Ambulatory Visit (INDEPENDENT_AMBULATORY_CARE_PROVIDER_SITE_OTHER): Payer: Self-pay

## 2021-04-29 ENCOUNTER — Encounter (HOSPITAL_COMMUNITY): Payer: Self-pay | Admitting: Emergency Medicine

## 2021-04-29 ENCOUNTER — Other Ambulatory Visit: Payer: Self-pay

## 2021-04-29 ENCOUNTER — Ambulatory Visit (HOSPITAL_COMMUNITY)
Admission: EM | Admit: 2021-04-29 | Discharge: 2021-04-29 | Disposition: A | Discharge status: Home / Self Care | Payer: Self-pay | Attending: Student | Admitting: Student

## 2021-04-29 DIAGNOSIS — M25571 Pain in right ankle and joints of right foot: Secondary | ICD-10-CM

## 2021-04-29 DIAGNOSIS — W108XXA Fall (on) (from) other stairs and steps, initial encounter: Secondary | ICD-10-CM

## 2021-04-29 DIAGNOSIS — W19XXXA Unspecified fall, initial encounter: Secondary | ICD-10-CM

## 2021-04-29 DIAGNOSIS — S8261XA Displaced fracture of lateral malleolus of right fibula, initial encounter for closed fracture: Secondary | ICD-10-CM

## 2021-04-29 NOTE — Discharge Instructions (Addendum)
-  You have a right ankle fracture: a mildly displaced transverse fracture of the distal fibula below the level of the tibial plafond. -CAM boot and crutches. Do not put weight on the foot.  -Rest, ice, tylenol/ibuprofen -Follow-up with an orthopedist at their earliest convenience. I recommend [facility] at [address] [place], [place] [address]. You can schedule an appointment by calling [phone]) or online ([email or web address]), but they also have a walk-in clinic M-F 8a-8p and Sat 10a-3p.

## 2021-04-29 NOTE — ED Triage Notes (Signed)
Pt presents with right ankle pain after fall this am.

## 2021-04-29 NOTE — ED Provider Notes (Signed)
[facility]    CSN: [id number] Arrival date & time: 04/29/21  1110      History   Chief Complaint Chief Complaint  Patient presents with   Fall   Ankle Pain    HPI Wendy Hooper is a 55 y.o. female presenting with right ankle pain following fall that occurred today.  Medical history noncontributory.  States that she fell while walking down her front steps, she actually fell on the same steps few years ago and broke the left foot.  This time, the right ankle hurts.  She is able to ambulate but with significant pain over the lateral malleolus.  States that she fell down 2 stairs and landed on her knees, with the right ankle inverting.  States that she primarily fell onto her knees, but she is not having any knee pain right now.  Denies pain or injury elsewhere, including hips, hands, shoulders, back.  Denies hitting her head, loss of consciousness, headaches, vision changes.  She does not take a blood thinner.  This patient is postmenopausal.  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      Home Medications    Prior to Admission medications   Medication Sig Start Date End Date Taking? Authorizing Provider  acetaminophen (TYLENOL) 500 MG tablet You can take 2 tablets every 6 hours as needed for pain.  Use this first.  You can buy this over the counter at any drug store.  Do not exceed 4000 mg of Tylenol per day, it can harm your liver. 01/25/18   Sherrie George, PA-C  amoxicillin-clavulanate (AUGMENTIN) 875-125 MG  tablet Take 1 tablet by mouth every 12 (twelve) hours. 01/25/18   Sherrie George, PA-C  azithromycin [place]) 250 MG tablet Take by mouth. 12/17/19   [provider]  ibuprofen (ADVIL,MOTRIN) 200 MG tablet You can take 2-3 tablets every 6 hours as needed for pain.  This should be your second choice for pain control.  You can buy this over the counter at any drug store. 01/25/18   Sherrie George, PA-C  losartan (COZAAR) 50 MG tablet Take by mouth. 09/18/19   [provider]  propranolol (INDERAL) 20 MG tablet Take by mouth. 10/08/19   [provider]  psyllium (HYDROCIL/METAMUCIL) 95 % PACK You want to have 1-2 soft Bowel movements per day.  This is your first line drug for this.  It does not work overnight, it is something you have to take daily.  I will let you decide if and how much you need, most people start with 1 scoop per day.You can buy this over the counter at any drug store.  Follow package instructions. 01/25/18   Sherrie George, PA-C  saccharomyces boulardii (FLORASTOR) 250 MG capsule You can buy this over the counter at any drug store.  It will be in the same area as the Metamucil/Citurcel products.  I would use this for the next couple weeks while you are on antibiotics.  This helps restore the good bacteria we  kill with antibiotics. Follow package directions. 01/25/18   Sherrie George, PA-C  traMADol (ULTRAM) 50 MG tablet Take 1 tablet (50 mg total) by mouth every 6 (six) hours as needed (Pain not relieved by Tylenol or ibuporfen). 01/25/18   Sherrie George, PA-C    Family History Family History  Problem Relation Age of Onset   Hypertension Mother    Hyperlipidemia Mother    Hyperlipidemia Father    COPD Father     Social History Social History   Tobacco Use   Smoking status: Never   Smokeless tobacco: Never  Vaping Use   Vaping Use: Never used  Substance Use Topics   Alcohol use: No   Drug use: No     Allergies   Augmentin  [amoxicillin-pot clavulanate] and Claritin [loratadine]   Review of Systems Review of Systems  Constitutional:  Negative for chills, fever and unexpected weight change.  Respiratory:  Negative for chest tightness and shortness of breath.   Cardiovascular:  Negative for chest pain and palpitations.  Gastrointestinal:  Negative for abdominal pain, diarrhea, nausea and vomiting.  Genitourinary:  Negative for decreased urine volume, difficulty urinating and frequency.  Musculoskeletal:  Negative for arthralgias, back pain, gait problem, joint swelling, myalgias, neck pain and neck stiffness.       R ankle pain  Skin:  Negative for wound.  Neurological:  Negative for dizziness, tremors, seizures, syncope, facial asymmetry, speech difficulty, weakness, light-headedness, numbness and headaches.  All other systems reviewed and are negative.   Physical Exam Triage Vital Signs ED Triage Vitals  Enc Vitals Group     BP 04/29/21 1243 (!) 158/94     Pulse Rate 04/29/21 1243 82     Resp 04/29/21 1243 17     Temp 04/29/21 1243 98.1 F (36.7 C)     Temp Source 04/29/21 1243 Oral     SpO2 04/29/21 1243 96 %     Weight --      Height --      Head Circumference --      Peak Flow --      Pain Score 04/29/21 1242 5     Pain Loc --      Pain Edu? --      Excl. in GC? --    No data found.  Updated Vital Signs BP (!) 158/94 (BP Location: Left Wrist)   Pulse 82   Temp 98.1 F (36.7 C) (Oral)   Resp 17   LMP 10/04/2016 (Approximate) Comment: denies pregnancy  SpO2 96%   Visual Acuity Right Eye Distance:   Left Eye Distance:   Bilateral Distance:    Right Eye Near:   Left Eye Near:    Bilateral Near:     Physical Exam Vitals reviewed.  Constitutional:      General: She is not in acute distress.    Appearance: Normal appearance. She is well-groomed. She is obese. She is not ill-appearing or diaphoretic.  HENT:     Head: Normocephalic and atraumatic.     Comments: No abrasion  ecchymosis or laceration to head or scalp.    Nose: Nose normal.     Mouth/Throat:     Mouth: No injury or lacerations.     Pharynx: Oropharynx is clear.     Comments: No lip or oral mucosal laceration Mandible is without tenderness or deformity. No trismus or TMJ.  Eyes:     General: Vision grossly intact.     Extraocular Movements: Extraocular movements intact.  Pupils: Pupils are equal, round, and reactive to light.     Comments: No orbital tenderness EOMI, PERRLA  Neck:     Comments: See MSK Cardiovascular:     Rate and Rhythm: Normal rate and regular rhythm.     Heart sounds: Normal heart sounds.  Pulmonary:     Effort: Pulmonary effort is normal.     Breath sounds: Normal breath sounds.  Chest:     Chest wall: No tenderness.  Abdominal:     Palpations: Abdomen is soft.     Tenderness: There is no abdominal tenderness. There is no guarding or rebound.  Musculoskeletal:        General: No swelling, tenderness, deformity or signs of injury. Normal range of motion.     Cervical back: Normal. No swelling, edema, deformity, erythema, signs of trauma, lacerations, rigidity, spasms, torticollis, tenderness, bony tenderness or crepitus. No pain with movement. Normal range of motion.     Thoracic back: Normal. No swelling, edema, deformity, signs of trauma, lacerations, spasms, tenderness or bony tenderness. Normal range of motion. No scoliosis.     Lumbar back: Normal. No swelling, edema, deformity, signs of trauma, lacerations, spasms, tenderness or bony tenderness. Normal range of motion. Negative right straight leg raise test and negative left straight leg raise test. No scoliosis.     Right lower leg: No edema.     Left lower leg: No edema.     Comments: R lateral malleolar effusion and mild tenderness. No obvious bony abnormality. ROM dorsiflexion and plantarflexion intact and with minimal pain. DP 2+, cap refill <2 seconds, sensation intact. No medial malleolar tenderness.  Ambulating in wheelchair.  No cervical, thoracic, lumbar paraspinous tenderness. No midline spinous tenderness deformity or stepoff. Strength and sensation grossly intact upper and lower extremities. No hip or pelvic instability. ROM flexion and extension intact of all major joints, without laxity tenderness or crepitus. No obvious bony deformity. No snuffbox tenderness.   Skin:    Findings: No signs of injury, laceration or lesion.     Comments: No skin changes  Neurological:     General: No focal deficit present.     Mental Status: She is alert and oriented to person, place, and time.     Cranial Nerves: Cranial nerves are intact. No cranial nerve deficit.     Sensory: Sensation is intact. No sensory deficit.     Motor: Motor function is intact. No weakness or pronator drift.     Coordination: Coordination is intact. Romberg sign negative. Finger-Nose-Finger Test normal.     Gait: Gait is intact. Gait normal.     Comments: CN 2-12 grossly intact, PERRLA, EOMI. Negative rhomberg, pronator drift, fingers to thumb.   Psychiatric:        Mood and Affect: Mood normal.        Behavior: Behavior normal.        Thought Content: Thought content normal.        Judgment: Judgment normal.     UC Treatments / Results  Labs (all labs ordered are listed, but only abnormal results are displayed) Labs Reviewed - No data to display  EKG   Radiology DG Ankle Complete Right  Result Date: 04/29/2021 CLINICAL DATA:  Fall EXAM: RIGHT ANKLE - COMPLETE 3 VIEW COMPARISON:  None. FINDINGS: Mildly displaced transverse fracture of the distal fibula below the level of the tibial plafond. Soft tissue swelling of the ankle. Alignment of the ankle is normal, and the mortise remains congruent. IMPRESSION: Mildly  displaced transverse fracture of the distal fibula below the level of the tibial plafond. Electronically Signed   By: Allegra Lai M.D.   On: 04/29/2021 13:11    Procedures Procedures (including  critical care time)  Medications Ordered in UC Medications - No data to display  Initial Impression / Assessment and Plan / UC Course  I have reviewed the triage vital signs and the nursing notes.  Pertinent labs & imaging results that were available during my care of the patient were reviewed by me and considered in my medical decision making (see chart for details).     This patient is a very pleasant 55 y.o. year old female presenting with R lateral malleolar fracture. Neurovascularly intact.   Xray R ankle: mildly displaced transverse fracture of the distal fibula below the level of the tibial plafond.  CAM boot, nonweightbearing, f/u with Ortho at earliest convenience.   Patient declines prescription pain medication in favor of Advil/tylenol.   ED return precautions discussed. Patient verbalizes understanding and agreement.     Final Clinical Impressions(s) / UC Diagnoses   Final diagnoses:  Closed displaced fracture of lateral malleolus of right fibula, initial encounter  Fall down stairs, initial encounter     Discharge Instructions      -You have a right ankle fracture: a mildly displaced transverse fracture of the distal fibula below the level of the tibial plafond. -CAM boot and crutches. Do not put weight on the foot.  -Rest, ice, tylenol/ibuprofen -Follow-up with an orthopedist at their earliest convenience. I recommend [facility] at [address], [place], [place] [address]. You can schedule an appointment by calling [phone]) or online [email or web address]), but they also have a walk-in clinic M-F 8a-8p and Sat 10a-3p.    ED Prescriptions   None    I have reviewed the PDMP during this encounter.   Rhys Martini, PA-C 04/29/21 1338

## 2021-10-01 ENCOUNTER — Ambulatory Visit (HOSPITAL_COMMUNITY)
Admission: EM | Admit: 2021-10-01 | Discharge: 2021-10-01 | Disposition: A | Discharge status: Home / Self Care | Payer: BC Managed Care – PPO | Attending: Family Medicine | Admitting: Family Medicine

## 2021-10-01 ENCOUNTER — Encounter (HOSPITAL_COMMUNITY): Payer: Self-pay | Admitting: Emergency Medicine

## 2021-10-01 ENCOUNTER — Other Ambulatory Visit: Payer: Self-pay

## 2021-10-01 DIAGNOSIS — I10 Essential (primary) hypertension: Secondary | ICD-10-CM

## 2021-10-01 DIAGNOSIS — R21 Rash and other nonspecific skin eruption: Secondary | ICD-10-CM

## 2021-10-01 MED ORDER — VALACYCLOVIR HCL 1 G PO TABS: 1000.0000 mg | ORAL_TABLET | Freq: Three times a day (TID) | ORAL | 0 refills | Status: DC

## 2021-10-01 NOTE — Discharge Instructions (Addendum)
Your blood pressure was noted to be elevated during your visit today. If you are currently taking medication for high blood pressure, please ensure you are taking this as directed. If you do not have a history of high blood pressure and your blood pressure remains persistently elevated, you may need to begin taking a medication at some point. You may return here within the next few days to recheck if unable to see your primary care provider or if you do not have a one. ? ?BP (!) 168/110 (BP Location: Right Arm) Comment (BP Location): right forearm  Pulse 80   Temp 98.3 ?F (36.8 ?C) (Oral)   Resp 20   LMP 10/04/2016 (Approximate) Comment: denies pregnancy  SpO2 98%  ? ?BP Readings from Last 3 Encounters:  ?10/01/21 (!) 168/110  ?04/29/21 (!) 158/94  ?02/17/20 (!) 151/105  ? ? ? ?

## 2021-10-01 NOTE — ED Provider Notes (Signed)
?[facility] ? ? ?[id number] ?10/01/21 Arrival Time: 1024 ? ?ASSESSMENT & PLAN: ? ?1. Rash and nonspecific skin eruption   ?2. Elevated blood pressure reading in office with diagnosis of hypertension   ? ?Suspicious for shingles. ?Begin: ?Meds ordered this encounter  ?Medications  ? valACYclovir (VALTREX) 1000 MG tablet  ?  Sig: Take 1 tablet (1,000 mg total) by mouth 3 (three) times daily.  ?  Dispense:  21 tablet  ?  Refill:  0  ? ? ? ?Discharge Instructions   ? ?  ?Your blood pressure was noted to be elevated during your visit today. If you are currently taking medication for high blood pressure, please ensure you are taking this as directed. If you do not have a history of high blood pressure and your blood pressure remains persistently elevated, you may need to begin taking a medication at some point. You may return here within the next few days to recheck if unable to see your primary care provider or if you do not have a one. ? ?BP (!) 168/110 (BP Location: Right Arm) Comment (BP Location): right forearm  Pulse 80   Temp 98.3 ?F (36.8 ?C) (Oral)   Resp 20   LMP 10/04/2016 (Approximate) Comment: denies pregnancy  SpO2 98%  ? ?BP Readings from Last 3 Encounters:  ?10/01/21 (!) 168/110  ?04/29/21 (!) 158/94  ?02/17/20 (!) 151/105  ? ? ? ? ? ? ?No s/s of HTN urgency. ? ?Will follow up with PCP or here if worsening or failing to improve as anticipated. ?Reviewed expectations re: course of current medical issues. Questions answered. ?Outlined signs and symptoms indicating need for more acute intervention. ?Patient verbalized understanding. ?After Visit Summary given. ? ? ?SUBJECTIVE: ? ?Nazaria Riesen is a 56 y.o. female who presents with a skin complaint. Ques beginning of shingles. R axilla and surrounding skin toward chest with "stabbing and poking" pain. H/O shingles with similar. Ques slight rash at axilla yesterday. No tx PTA. ? ?Increased blood pressure noted today. Reports that she is treated  for HTN. ? ?She reports taking medications as instructed, no chest pain on exertion, and no dyspnea on exertion. ? ? OBJECTIVE: ?Vitals:  ? 10/01/21 1118  ?BP: (!) 168/110  ?Pulse: 80  ?Resp: 20  ?Temp: 98.3 ?F (36.8 ?C)  ?TempSrc: Oral  ?SpO2: 98%  ?  ?General appearance: alert; no distress ?HEENT: [place]; AT ?Neck: supple with FROM ?Lungs: unlabored ?Extremities: no edema; moves all extremities normally ?Skin: warm and dry; signs of infection: no; slight superficial couple of patches of red/purplish skin changes over R axilla; a few papules; is TTP ?Psychological: alert and cooperative; normal mood and affect ? ?Allergies  ?Allergen Reactions  ? Augmentin [Amoxicillin-Pot Clavulanate] Nausea And Vomiting  ? Claritin [Loratadine]   ?  Panic attacks  ? ? ?Past Medical History:  ?Diagnosis Date  ? Dizziness   ? Hypertension   ? ?Social History  ? ?Socioeconomic History  ? Marital status: Single  ?  Spouse name: Not on file  ? Number of children: Not on file  ? Years of education: Not on file  ? Highest education level: Not on file  ?Occupational History  ? Not on file  ?Tobacco Use  ? Smoking status: Never  ? Smokeless tobacco: Never  ?Vaping Use  ? Vaping Use: Never used  ?Substance and Sexual Activity  ? Alcohol use: No  ? Drug use: No  ? Sexual activity: Not on file  ?Other Topics Concern  ?  Not on file  ?Social History Narrative  ? Not on file  ? ?Social Determinants of Health  ? ?Financial Resource Strain: Not on file  ?Food Insecurity: Not on file  ?Transportation Needs: Not on file  ?Physical Activity: Not on file  ?Stress: Not on file  ?Social Connections: Not on file  ?Intimate Partner Violence: Not on file  ? ?Family History  ?Problem Relation Age of Onset  ? Hypertension Mother   ? Hyperlipidemia Mother   ? Hyperlipidemia Father   ? COPD Father   ? ?Past Surgical History:  ?Procedure Laterality Date  ? INCISION AND DRAINAGE PERIRECTAL ABSCESS N/A 01/22/2018  ? Procedure: IRRIGATION AND DEBRIDEMENT PERIRECTAL  ABSCESS;  Surgeon: Almond Lint, MD;  Location: [facility];  Service: General;  Laterality: N/A;  ? ? ?  ?Mardella Layman, MD ?10/01/21 1245 ? ?

## 2021-10-01 NOTE — ED Triage Notes (Signed)
Patient is concerned for shingles.  The area mentioned is posterior, axilla.  Patient reports a lot of pain, like glass in skin, stabbing, poking feeling.  Reports she is familiar with chafing and this does not feel like that.  Noticed this area last night.   ?

## 2022-01-30 IMAGING — DX DG ANKLE COMPLETE 3+V*R*
3 series · 3 of 3 positions shown · non-contrast
Comparison: None.

CLINICAL DATA: Fall

EXAM:
RIGHT ANKLE - COMPLETE 3 VIEW

[ankle ap]
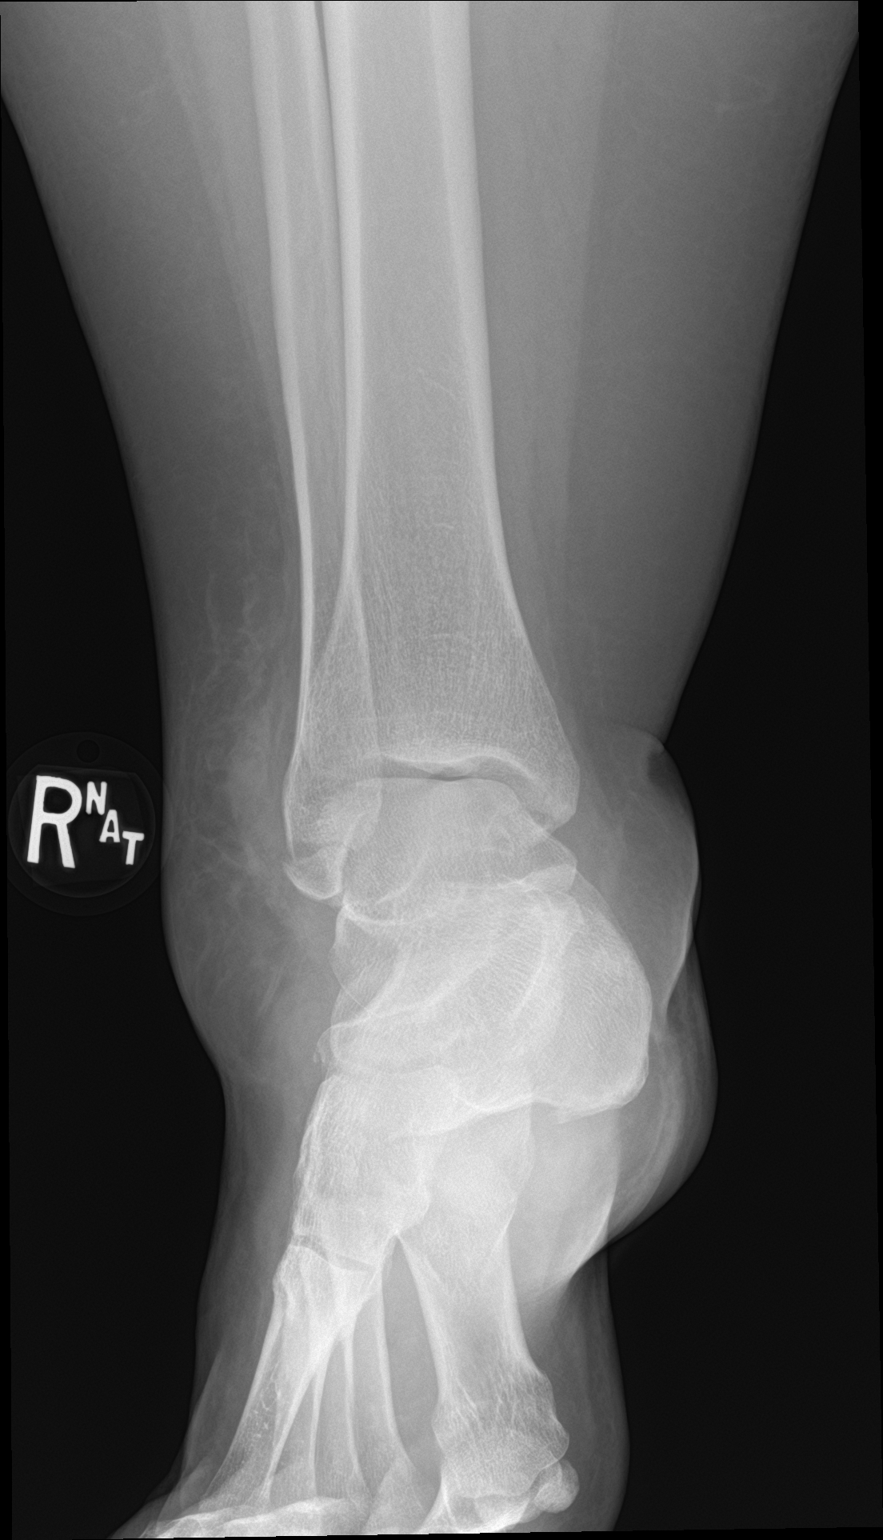

[ankle obl]
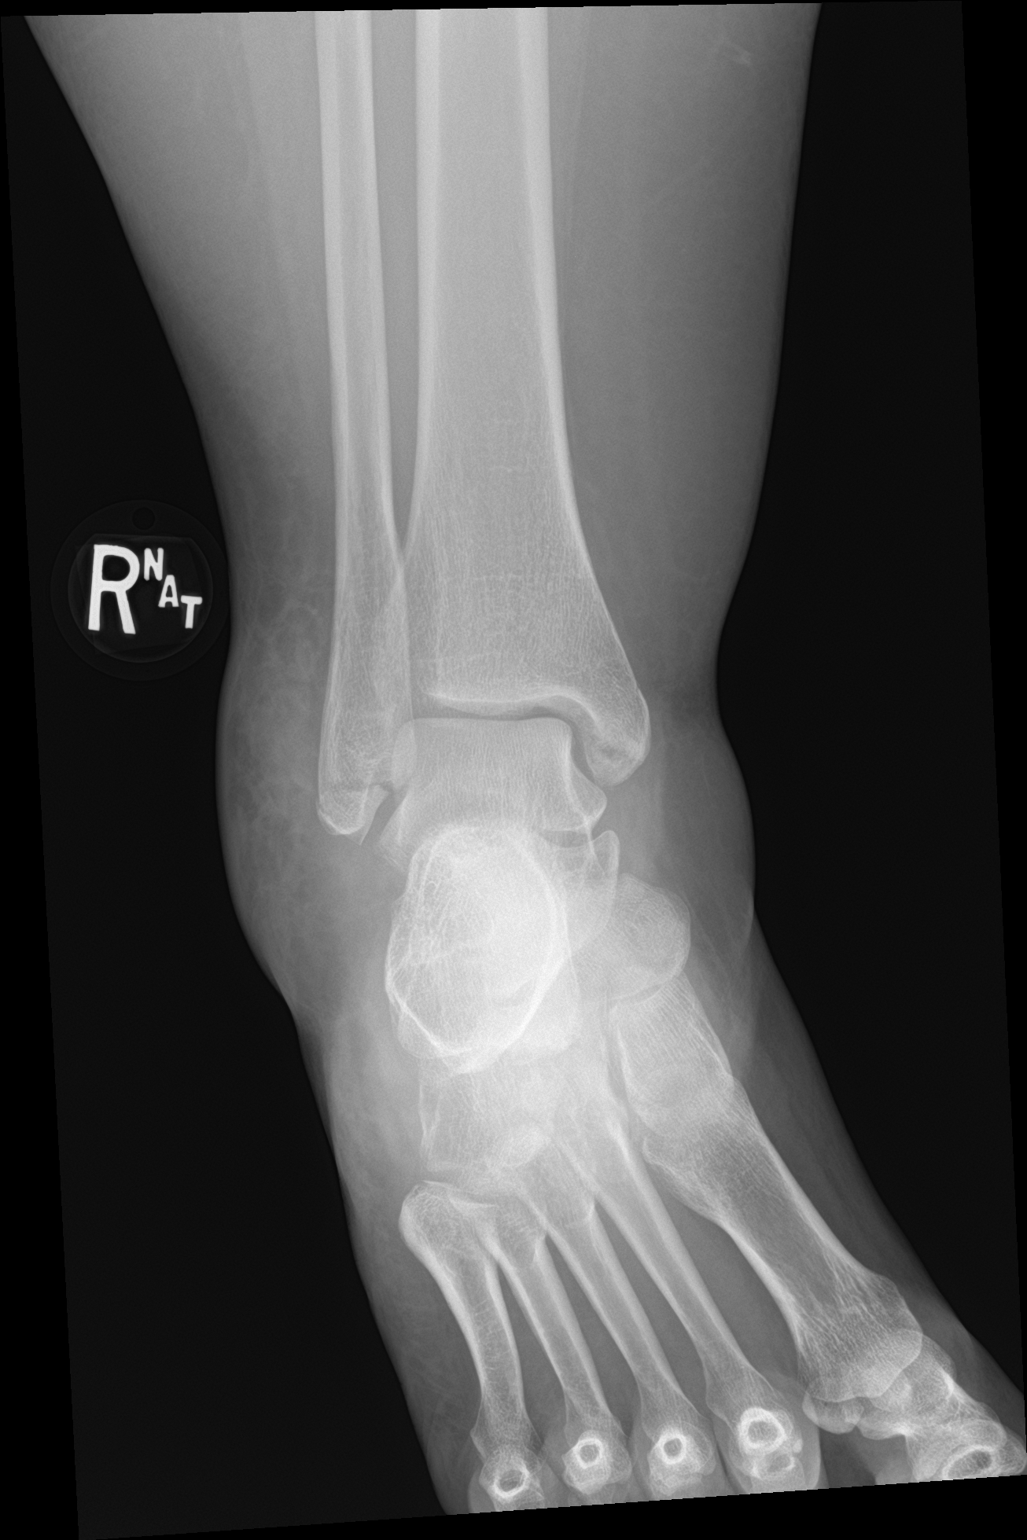

[ankle lat]
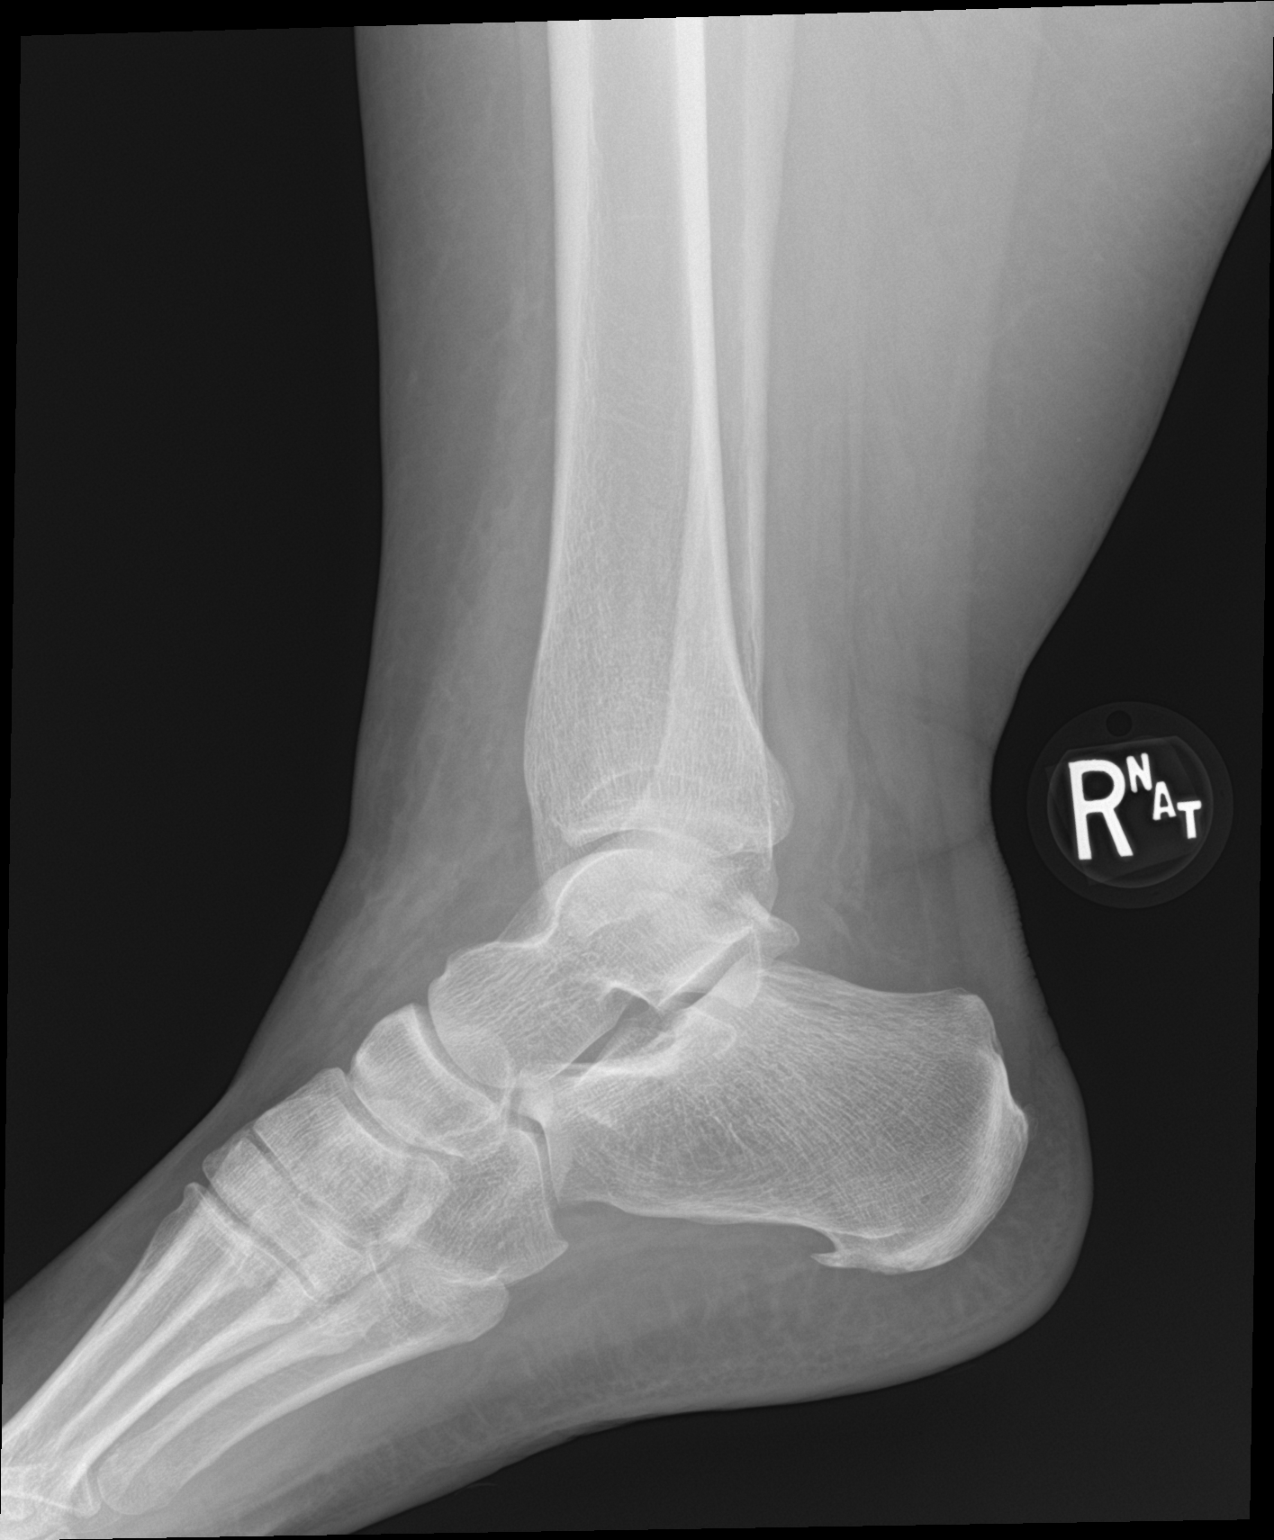

[3 of 3 positions shown; findings below may reference images not displayed]

FINDINGS: Mildly displaced transverse fracture of the distal fibula below the
level of the tibial plafond. Soft tissue swelling of the ankle.
Alignment of the ankle is normal, and the mortise remains congruent.
IMPRESSION: Mildly displaced transverse fracture of the distal fibula below the
level of the tibial plafond.

## 2022-07-09 ENCOUNTER — Encounter (HOSPITAL_COMMUNITY): Payer: Self-pay | Admitting: *Deleted

## 2022-07-09 ENCOUNTER — Ambulatory Visit (HOSPITAL_COMMUNITY)
Admission: EM | Admit: 2022-07-09 | Discharge: 2022-07-09 | Disposition: A | Discharge status: Home / Self Care | Payer: BC Managed Care – PPO | Attending: Emergency Medicine | Admitting: Emergency Medicine

## 2022-07-09 DIAGNOSIS — N390 Urinary tract infection, site not specified: Secondary | ICD-10-CM | POA: Insufficient documentation

## 2022-07-09 LAB — POCT URINALYSIS DIPSTICK, ED / UC
Bilirubin Urine: NEGATIVE
Glucose, UA: 100 mg/dL — AB
Hgb urine dipstick: NEGATIVE
Ketones, ur: NEGATIVE mg/dL
Leukocytes,Ua: NEGATIVE
Nitrite: POSITIVE — AB
Protein, ur: 30 mg/dL — AB
Specific Gravity, Urine: 1.02 (ref 1.005–1.030)
Urobilinogen, UA: 1 mg/dL (ref 0.0–1.0)
pH: 7 (ref 5.0–8.0)

## 2022-07-09 MED ORDER — NITROFURANTOIN MONOHYD MACRO 100 MG PO CAPS: 100.0000 mg | ORAL_CAPSULE | Freq: Two times a day (BID) | ORAL | 0 refills | Status: DC

## 2022-07-09 NOTE — ED Provider Notes (Signed)
[facility]    CSN: [id number] Arrival date & time: 07/09/22  1108      History   Chief Complaint Chief Complaint  Patient presents with   Abdominal Pain   Urinary Frequency    HPI Wendy Hooper is a 56 y.o. female.  Patient presents complaining of urinary frequency and lower mid abdominal pain that has been ongoing for the past 2 weeks.  Patient reports that symptoms have not worsened but just been constant.  She denies any recent incident that she feels like is causative of her symptoms.  She denies any burning with urination or changes to urine characteristics.She denies any history of urinary tract infection.  She currently denies any systemic symptoms. Patient states that she has used Azo with minimal relief of symptoms.    Abdominal Pain Associated symptoms: no chest pain, no chills, no diarrhea, no dysuria, no fatigue, no fever, no hematuria, no nausea, no vaginal bleeding, no vaginal discharge and no vomiting   Urinary Frequency Pertinent negatives include no chest pain and no abdominal pain.    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      Home Medications    Prior to Admission medications   Medication Sig Start Date End Date Taking? Authorizing Provider  amLODipine (NORVASC) 5 MG tablet Take by mouth.   Yes [provider]  nitrofurantoin, macrocrystal-monohydrate, (MACROBID) 100 MG capsule Take 1 capsule (100 mg total) by mouth 2 (two) times daily. 07/09/22  Yes Debby Freiberg, NP   acetaminophen (TYLENOL) 500 MG tablet You can take 2 tablets every 6 hours as needed for pain.  Use this first.  You can buy this over the counter at any drug store.  Do not exceed 4000 mg of Tylenol per day, it can harm your liver. 01/25/18   Sherrie George, PA-C  ibuprofen (ADVIL,MOTRIN) 200 MG tablet You can take 2-3 tablets every 6 hours as needed for pain.  This should be your second choice for pain control.  You can buy this over the counter at any drug store. 01/25/18   Sherrie George, PA-C  losartan (COZAAR) 50 MG tablet Take by mouth. 09/18/19   [provider]  propranolol (INDERAL) 20 MG tablet Take by mouth. 10/08/19   [provider]  psyllium (HYDROCIL/METAMUCIL) 95 % PACK You want to have 1-2 soft Bowel movements per day.  This is your first line drug for this.  It does not work overnight, it is something you have to take daily.  I will let you decide if and how much you need, most people start with 1 scoop per day.You can buy this over the counter at any drug store.  Follow package instructions. 01/25/18   Sherrie George, PA-C  valACYclovir (VALTREX) 1000 MG tablet Take 1 tablet (1,000 mg total) by mouth 3 (three) times daily. 10/01/21   Mardella Layman, MD    Family History Family History  Problem Relation Age of Onset   Hypertension Mother    Hyperlipidemia Mother  Hyperlipidemia Father    COPD Father     Social History Social History   Tobacco Use   Smoking status: Never   Smokeless tobacco: Never  Vaping Use   Vaping Use: Never used  Substance Use Topics   Alcohol use: No   Drug use: No     Allergies   Augmentin [amoxicillin-pot clavulanate] and Claritin [loratadine]   Review of Systems Review of Systems  Constitutional:  Negative for activity change, appetite change, chills, fatigue and fever.  Respiratory: Negative.    Cardiovascular:  Negative for chest pain and palpitations.  Gastrointestinal:  Negative for abdominal distention,  abdominal pain, diarrhea, nausea and vomiting.  Endocrine: Negative for cold intolerance, heat intolerance, polydipsia, polyphagia and polyuria.  Genitourinary:  Positive for frequency. Negative for decreased urine volume, difficulty urinating, dysuria, enuresis, flank pain, hematuria, menstrual problem, pelvic pain, urgency, vaginal bleeding, vaginal discharge and vaginal pain.  Musculoskeletal:  Negative for myalgias.     Physical Exam Triage Vital Signs ED Triage Vitals  Enc Vitals Group     BP 07/09/22 1243 (!) 165/91     Pulse Rate 07/09/22 1243 96     Resp 07/09/22 1243 20     Temp 07/09/22 1243 98.2 F (36.8 C)     Temp Source 07/09/22 1243 Oral     SpO2 07/09/22 1243 97 %     Weight --      Height --      Head Circumference --      Peak Flow --      Pain Score 07/09/22 1241 6     Pain Loc --      Pain Edu? --      Excl. in GC? --    No data found.  Updated Vital Signs BP (!) 165/91 (BP Location: Left Arm)   Pulse 96   Temp 98.2 F (36.8 C) (Oral)   Resp 20   LMP 10/04/2016 (Approximate) Comment: denies pregnancy  SpO2 97%     Physical Exam Vitals and nursing note reviewed.  Constitutional:      Appearance: She is well-developed.  Abdominal:     General: Abdomen is flat. Bowel sounds are normal. There is no distension or abdominal bruit. There are no signs of injury.     Palpations: Abdomen is soft. There is no shifting dullness, fluid wave, hepatomegaly, splenomegaly, mass or pulsatile mass.     Tenderness: There is abdominal tenderness in the suprapubic area. There is no right CVA tenderness, left CVA tenderness, guarding or rebound. Negative signs include McBurney's sign.  Neurological:     Mental Status: She is alert.      UC Treatments / Results  Labs (all labs ordered are listed, but only abnormal results are displayed) Labs Reviewed  POCT URINALYSIS DIPSTICK, ED / UC - Abnormal; Notable for the following components:      Result Value   Glucose,  UA 100 (*)    Protein, ur 30 (*)    Nitrite POSITIVE (*)    All other components within normal limits  URINE CULTURE    EKG   Radiology No results found.  Procedures Procedures (including critical care time)  Medications Ordered in UC Medications - No data to display  Initial Impression / Assessment and Plan / UC Course  I have reviewed the triage vital signs and the nursing notes.  Pertinent labs & imaging results that were available during my care of the patient were reviewed by me and considered in  my medical decision making (see chart for details).     Patient was evaluated for urinary tract infection.  Urinalysis showed positive nitrite, protein 30, and 100 glucose.  Based on physical assessment, symptomology, and urinalysis etiology of symptoms seems to be related to UTI.  Glucosuria present, low suspicion of DM, no reported signs, and patient follows up with a PCP, and last A1c 2 months ago was 5.7%.  Macrobid was sent to the pharmacy and patient was made aware of treatment regiment.  Urine culture sent to verify appropriate antimicrobial regiment was prescribed due to suprapubic pain.Patient was made aware to increase water intake.Patient made aware of timeline for symptom resolution and when follow-up would be necessary.  Patient made aware of results reporting protocol and MyChart.  Patient verbalized understanding of instructions.    Charting was provided using a a verbal dictation system, charting was proofread for errors, errors may occur which could change the meaning of the information charted.   Final Clinical Impressions(s) / UC Diagnoses   Final diagnoses:  Urinary tract infection without hematuria, site unspecified     Discharge Instructions      We will call you if your test results warrant a change in your plan of care.   Macrobid has been sent to the pharmacy, he will take this 2 times daily for the next 5 days.  Please make sure to increase your fluid  intake, drinking at least 8 cups of water daily.  Please follow-up if your symptoms are not improving.      ED Prescriptions     Medication Sig Dispense Auth. Provider   nitrofurantoin, macrocrystal-monohydrate, (MACROBID) 100 MG capsule Take 1 capsule (100 mg total) by mouth 2 (two) times daily. 10 capsule Debby Freiberg, NP      PDMP not reviewed this encounter.   Debby Freiberg, NP 07/09/22 [phone]

## 2022-07-09 NOTE — ED Triage Notes (Addendum)
Pt states that she has had lower abdominal pain and urine frequency x 2 weeks. She has clindamycin 300 mg at home so she took 2 tabs a day for 6 days but she said it didn't help sx.   Pt states that she took AZO

## 2022-07-09 NOTE — Discharge Instructions (Addendum)
We will call you if your test results warrant a change in your plan of care.   Macrobid has been sent to the pharmacy, he will take this 2 times daily for the next 5 days.  Please make sure to increase your fluid intake, drinking at least 8 cups of water daily.  Please follow-up if your symptoms are not improving.

## 2022-07-10 LAB — URINE CULTURE
Culture: NO GROWTH
Special Requests: NORMAL

## 2022-12-28 ENCOUNTER — Encounter: Payer: Self-pay | Admitting: Physician Assistant

## 2022-12-28 ENCOUNTER — Telehealth: Payer: BC Managed Care – PPO | Admitting: Physician Assistant

## 2022-12-28 DIAGNOSIS — K1120 Sialoadenitis, unspecified: Secondary | ICD-10-CM | POA: Diagnosis not present

## 2022-12-28 MED ORDER — LEVOFLOXACIN 500 MG PO TABS: 500.0000 mg | ORAL_TABLET | Freq: Every day | ORAL | 0 refills | Status: AC

## 2022-12-28 NOTE — Patient Instructions (Signed)
Wendy Hooper, thank you for joining Piedad Climes, PA-C for today's virtual visit.  While this provider is not your primary care provider (PCP), if your PCP is located in our provider database this encounter information will be shared with them immediately following your visit.   A [facility] MyChart account gives you access to today's visit and all your visits, tests, and labs performed at [facility] " click here if you don't have a [facility] MyChart account or go to [email or web address]  Consent: (Patient) Wendy Hooper provided verbal consent for this virtual visit at the beginning of the encounter.  Current Medications:  Current Outpatient Medications:    levofloxacin (LEVAQUIN) 500 MG tablet, Take 1 tablet (500 mg total) by mouth daily for 7 days., Disp: 7 tablet, Rfl: 0   acetaminophen (TYLENOL) 500 MG tablet, You can take 2 tablets every 6 hours as needed for pain.  Use this first.  You can buy this over the counter at any drug store.  Do not exceed 4000 mg of Tylenol per day, it can harm your liver., Disp: 30 tablet, Rfl: 0   amLODipine (NORVASC) 5 MG tablet, Take by mouth., Disp: , Rfl:    ibuprofen (ADVIL,MOTRIN) 200 MG tablet, You can take 2-3 tablets every 6 hours as needed for pain.  This should be your second choice for pain control.  You can buy this over the counter at any drug store., Disp: , Rfl:    nitrofurantoin, macrocrystal-monohydrate, (MACROBID) 100 MG capsule, Take 1 capsule (100 mg total) by mouth 2 (two) times daily., Disp: 10 capsule, Rfl: 0   propranolol (INDERAL) 20 MG tablet, Take by mouth., Disp: , Rfl:    psyllium (HYDROCIL/METAMUCIL) 95 % PACK, You want to have 1-2 soft Bowel movements per day.  This is your first line drug for this.  It does not work overnight, it is something you have to take daily.  I will let you decide if and how much you need, most people start with 1 scoop per day.You can buy this over the counter at any drug  store.  Follow package instructions., Disp: 56 each, Rfl:    Medications ordered in this encounter:  Meds ordered this encounter  Medications   levofloxacin (LEVAQUIN) 500 MG tablet    Sig: Take 1 tablet (500 mg total) by mouth daily for 7 days.    Dispense:  7 tablet    Refill:  0    Order Specific Question:   Supervising Provider    Answer:   Merrilee Jansky [id number]     *If you need refills on other medications prior to your next appointment, please contact your pharmacy*  Follow-Up: Call back or seek an in-person evaluation if the symptoms worsen or if the condition fails to improve as anticipated.  [facility] [phone]  Other Instructions Please take the antibiotic as directed. Keep hydrated and rest. Alternate Tylenol and Ibuprofen.  Start cool compresses. Monitor symptoms closely. If no substantial improvement over next 48-72 hours or anything worsening, you need in-person evaluation ASAP.    If you have been instructed to have an in-person evaluation today at a local Urgent Care facility, please use the link below. It will take you to a list of all of our available [facility], including address, phone number and hours of operation. Please do not delay care.  [facility]  If you or a family member do not have a primary care  provider, use the link below to schedule a visit and establish care. When you choose a [facility] primary care physician or advanced practice provider, you gain a long-term partner in health. Find a Primary Care Provider  Learn more about [facility]'s in-office and virtual care options: [facility] - Get Care Now

## 2022-12-28 NOTE — Progress Notes (Signed)
Virtual Visit Consent   Wendy Hooper, you are scheduled for a virtual visit with a [facility] provider today. Just as with appointments in the office, your consent must be obtained to participate. Your consent will be active for this visit and any virtual visit you may have with one of our providers in the next 365 days. If you have a MyChart account, a copy of this consent can be sent to you electronically.  As this is a virtual visit, video technology does not allow for your provider to perform a traditional examination. This may limit your provider's ability to fully assess your condition. If your provider identifies any concerns that need to be evaluated in person or the need to arrange testing (such as labs, EKG, etc.), we will make arrangements to do so. Although advances in technology are sophisticated, we cannot ensure that it will always work on either your end or our end. If the connection with a video visit is poor, the visit may have to be switched to a telephone visit. With either a video or telephone visit, we are not always able to ensure that we have a secure connection.  By engaging in this virtual visit, you consent to the provision of healthcare and authorize for your insurance to be billed (if applicable) for the services provided during this visit. Depending on your insurance coverage, you may receive a charge related to this service.  I need to obtain your verbal consent now. Are you willing to proceed with your visit today? Wendy Hooper has provided verbal consent on 12/28/2022 for a virtual visit (video or telephone). Piedad Climes, [place]  Date: 12/28/2022 9:31 AM  Virtual Visit via Video Note   I, Piedad Climes, connected with  Wendy Hooper  ([id number], [birth year]) on 12/28/22 at  8:45 AM EDT by a video-enabled telemedicine application and verified that I am speaking with the correct person using two identifiers.  Location: Patient: Virtual Visit Location  Patient: Home Provider: Virtual Visit Location Provider: Home Office   I discussed the limitations of evaluation and management by telemedicine and the availability of in person appointments. The patient expressed understanding and agreed to proceed.    History of Present Illness: Wendy Hooper is a 57 y.o. who identifies as a female who was assigned female at birth, and is being seen today for concern of possible mumps.  Forces over the past couple of weeks not feeling the greatest patient endorses with some headache, neck ache and fatigue.  Initially thought was related to migraines but seem to continue.  On Monday she started noticing some mild swelling of her lymph nodes of the neck then some swelling of the glands in front of her ears.  As such she went to be evaluated by her primary care provider yesterday.  States they did not feel there was any concern for mumps.  Was told she just had a mild virus and instructed to keep hydrated, rest and OTC medications were reviewed.  Patient denies fever, chills.  Notes today the swelling is worse bilaterally with some associated tenderness.  Denies any change to hearing.  Notes odd taste in mouth. Denies recent travel or sick contact. No substantial exposure to children. Does work in a factory setting.   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  Use this first.  You can buy this over the counter at any drug store.  Do not exceed 4000 mg of  Tylenol per day, it can harm your liver., Disp: 30 tablet, Rfl: 0   amLODipine (NORVASC) 5 MG tablet, Take by mouth., Disp: , Rfl:    ibuprofen (ADVIL,MOTRIN) 200 MG tablet, You can take 2-3 tablets every 6 hours as needed for pain.  This should be your second choice for pain control.  You can buy this over the counter at any drug store., Disp: , Rfl:    nitrofurantoin, macrocrystal-monohydrate, (MACROBID) 100 MG capsule, Take 1 capsule (100 mg total) by mouth 2 (two) times daily., Disp: 10 capsule, Rfl: 0   propranolol (INDERAL) 20 MG tablet, Take by mouth., Disp: , Rfl:    psyllium (HYDROCIL/METAMUCIL) 95 % PACK, You want to have 1-2 soft Bowel movements per day.  This is your first line drug for this.  It does not work overnight, it is something you have to take daily.  I will let you decide if and how much you need, most people start with 1 scoop per day.You can buy this over the counter at any drug store.  Follow package instructions., Disp: 56 each, Rfl:   Observations/Objective: Patient is well-developed, well-nourished in no acute distress.  Resting comfortably at home.  Head is normocephalic, atraumatic.  No labored breathing. Speech is clear and coherent with logical content.  Patient is alert and oriented at baseline.  Visible bilateral parotid gland swelling noted.    Assessment and Plan: 1. Parotitis - levofloxacin (LEVAQUIN) 500 MG tablet; Take 1 tablet (500 mg total) by mouth daily for 7 days.  Dispense: 7 tablet; Refill: 0  Concern that initially viral giving her prodrome prior to glandular swelling. Was improving but now worsening again with increased tenderness and odd taste in mouth. Afebrile at present. Could have been mumps versus parainfluenza or other viral parotitis giving her lower risk of contracting mumps. Supportive measures and OTC medications reviewed for this. Now with concern of secondary bacterial infection. Giving her allergies/intolerances, will start Levaquin  once daily for 7 days. Strict in-person evaluation precautions reviewed. Work note provided.   Follow Up Instructions: I discussed the assessment and treatment plan with the patient. The patient was provided an opportunity to ask questions and all were answered. The patient agreed with the plan and demonstrated an understanding of the instructions.  A copy of instructions were sent to the patient via MyChart unless otherwise noted below.   The patient was advised to call back or seek an in-person evaluation if the symptoms worsen or if the condition fails to improve as anticipated.  Time:  I spent 10 minutes with the patient via telehealth technology discussing the above problems/concerns.    Piedad Climes, PA-C

## 2022-12-30 ENCOUNTER — Telehealth: Payer: BC Managed Care – PPO | Admitting: Family Medicine

## 2022-12-30 DIAGNOSIS — R0789 Other chest pain: Secondary | ICD-10-CM | POA: Diagnosis not present

## 2022-12-30 NOTE — Patient Instructions (Signed)
Wendy Hooper, thank you for joining [name], PA-C for today's virtual visit.  While this provider is not your primary care provider (PCP), if your PCP is located in our provider database this encounter information will be shared with them immediately following your visit.   [facility] MyChart account gives you access to today's visit and all your visits, tests, and labs performed at [facility] " click here if you don't have a [name] MyChart account or go to [email or web address]  Consent: (Patient) Wendy Hooper provided verbal consent for this virtual visit at the beginning of the encounter.  Current Medications:  Current Outpatient Medications:    acetaminophen (TYLENOL) 500 MG tablet, You can take 2 tablets every 6 hours as needed for pain.  Use this first.  You can buy this over the counter at any drug store.  Do not exceed 4000 mg of Tylenol per day, it can harm your liver., Disp: 30 tablet, Rfl: 0   amLODipine (NORVASC) 5 MG tablet, Take by mouth., Disp: , Rfl:    ibuprofen (ADVIL,MOTRIN) 200 MG tablet, You can take 2-3 tablets every 6 hours as needed for pain.  This should be your second choice for pain control.  You can buy this over the counter at any drug store., Disp: , Rfl:    levofloxacin (LEVAQUIN) 500 MG tablet, Take 1 tablet (500 mg total) by mouth daily for 7 days., Disp: 7 tablet, Rfl: 0   nitrofurantoin, macrocrystal-monohydrate, (MACROBID) 100 MG capsule, Take 1 capsule (100 mg total) by mouth 2 (two) times daily., Disp: 10 capsule, Rfl: 0   propranolol (INDERAL) 20 MG tablet, Take by mouth., Disp: , Rfl:    psyllium (HYDROCIL/METAMUCIL) 95 % PACK, You want to have 1-2 soft Bowel movements per day.  This is your first line drug for this.  It does not work overnight, it is something you have to take daily.  I will let you decide if and how much you need, most people start with 1 scoop per day.You can buy this over the counter at any drug store.   Follow package instructions., Disp: 56 each, Rfl:    Medications ordered in this encounter:  No orders of the defined types were placed in this encounter.    *If you need refills on other medications prior to your next appointment, please contact your pharmacy*  Follow-Up: Call back or seek an in-person evaluation if the symptoms worsen or if the condition fails to improve as anticipated.  [name] [phone]  Other Instructions Costochondritis  Costochondritis is irritation and swelling (inflammation) of the tissue that connects the ribs to the breastbone (sternum). This tissue is called cartilage. This condition causes pain in the front of the chest. The pain often starts slowly. It may be in more than one rib. What are the causes? The cause of this condition is not always known. It can come from stress on the sternum. The cause of this stress could be: Chest injury. Exercise or activity. This may include lifting. Very bad coughing. What increases the risk? Being female. Being 26-65 years old. Starting a new exercise or work activity. Having low levels of vitamin D. Having a condition that makes you cough a lot. What are the signs or symptoms? Chest pain that: Starts slowly. It can be sharp or dull. Gets worse with deep breathing, coughing, or exercise. Gets better with rest. May be worse when you press on your ribs and breastbone. How is this  treated? In most cases, this condition goes away on its own over time. You may need to take an NSAID, such as ibuprofen. This can help reduce pain. You may also need to: Rest and stay away from activities that make pain worse. Put heat or ice on the area that hurts. Do exercises to stretch your chest muscles. If these treatments do not help, your doctor may inject a medicine to numb the area. This can help relieve the pain. Follow these instructions at home: Managing pain, stiffness, and swelling     If told,  put ice on the painful area. To do this: Put ice in a plastic bag. Place a towel between your skin and the bag. Leave the ice on for 20 minutes, 2-3 times a day. If told, put heat on the affected area. Do this as often as told by your doctor. Use the heat source that your doctor recommends, such as a moist heat pack or a heating pad. Place a towel between your skin and the heat source. Leave the heat on for 20-30 minutes. If your skin turns bright red, take off the ice or heat right away to prevent skin damage. The risk of skin damage is higher if you cannot feel pain, heat, or cold. Activity Rest as told by your doctor. Do not do things that make your pain worse. This includes activities that use your chest, belly (abdomen), and side muscles. You may have to avoid lifting. Ask your doctor how much you can safely lift. Return to your normal activities when your doctor says that it is safe. General instructions Take over-the-counter and prescription medicines only as told by your doctor. Contact a doctor if: You have chills or a fever. Your pain does not go away or gets worse. You have a cough that does not go away. Get help right away if: You have a hard time breathing. You have very bad chest pain that does not get better with medicines, heat, or ice. These symptoms may be an emergency. Get help right away. Call 911. Do not wait to see if the symptoms will go away. Do not drive yourself to the hospital. This information is not intended to replace advice given to you by your health care provider. Make sure you discuss any questions you have with your health care provider. Document Revised: 01/26/2022 Document Reviewed: 01/26/2022 Elsevier Patient Education  2024 Elsevier Inc.    If you have been instructed to have an in-person evaluation today at a local Urgent Care facility, please use the link below. It will take you to a list of all of our available [facility],  including address, phone number and hours of operation. Please do not delay care.  [facility]  If you or a family member do not have a primary care provider, use the link below to schedule a visit and establish care. When you choose a [facility] primary care physician or advanced practice provider, you gain a long-term partner in health. Find a Primary Care Provider  Learn more about [facility]'s in-office and virtual care options: [place] - Get Care Now

## 2022-12-30 NOTE — Progress Notes (Signed)
Virtual Visit Consent   Wendy Hooper, you are scheduled for a virtual visit with a Wendy Hooper today. Just as with appointments in the office, your consent must be obtained to participate. Your consent will be active for this visit and any virtual visit you may have with one of our providers in the next 365 days. If you have a MyChart account, a copy of this consent can be sent to you electronically.  As this is a virtual visit, video technology does not allow for your Hooper to perform a traditional examination. This may limit your Hooper's ability to fully assess your condition. If your Hooper identifies any concerns that need to be evaluated in person or the need to arrange testing (such as labs, EKG, etc.), we will make arrangements to do so. Although advances in technology are sophisticated, we cannot ensure that it will always work on either your end or our end. If the connection with a video visit is poor, the visit may have to be switched to a telephone visit. With either a video or telephone visit, we are not always able to ensure that we have a secure connection.  By engaging in this virtual visit, you consent to the provision of healthcare and authorize for your insurance to be billed (if applicable) for the services provided during this visit. Depending on your insurance coverage, you may receive a charge related to this service.  I need to obtain your verbal consent now. Are you willing to proceed with your visit today? Wendy Hooper has provided verbal consent on 12/30/2022 for a virtual visit (video or telephone). Wendy Hooper, Wendy Hooper  Date: 12/30/2022 12:51 PM  Virtual Visit via Video Note   IReed Hooper, connected with  Wendy Hooper  ([id number], January 02, 1966) on 12/30/22 at 12:45 PM EDT by a video-enabled telemedicine application and verified that I am speaking with the correct person using two identifiers.  Location: Patient: Virtual Visit Location Patient:  Home Hooper: Virtual Visit Location Hooper: Home Office   I discussed the limitations of evaluation and management by telemedicine and the availability of in person appointments. The patient expressed understanding and agreed to proceed.    History of Present Illness: Wendy Hooper is a 57 y.o. who identifies as a female who was assigned female at birth, and is being seen today for Pt states she wanted to follow up after going to her PCP on Wednesday to see if she had the mumps or some other virus.  Pt states for the most part she feels better but states her chest feels sore. Pt states she is taking Tylenol and Advil that does help with the pain. Pt states it seems like the soreness is going down to her chest. Pt states the soreness in face and neck have improved, although she states she still has some swelling to the sides of her face and neck.   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  Use this first.  You can buy this over the counter at any drug store.  Do not exceed 4000 mg of Tylenol per  day, it can harm your liver., Disp: 30 tablet, Rfl: 0   amLODipine (NORVASC) 5 MG tablet, Take by mouth., Disp: , Rfl:    ibuprofen (ADVIL,MOTRIN) 200 MG tablet, You can take 2-3 tablets every 6 hours as needed for pain.  This should be your second choice for pain control.  You can buy this over the counter at any drug store., Disp: , Rfl:    levofloxacin (LEVAQUIN) 500 MG tablet, Take 1 tablet (500 mg total) by mouth daily for 7 days., Disp: 7 tablet, Rfl: 0   nitrofurantoin, macrocrystal-monohydrate,  (MACROBID) 100 MG capsule, Take 1 capsule (100 mg total) by mouth 2 (two) times daily., Disp: 10 capsule, Rfl: 0   propranolol (INDERAL) 20 MG tablet, Take by mouth., Disp: , Rfl:    psyllium (HYDROCIL/METAMUCIL) 95 % PACK, You want to have 1-2 soft Bowel movements per day.  This is your first line drug for this.  It does not work overnight, it is something you have to take daily.  I will let you decide if and how much you need, most people start with 1 scoop per day.You can buy this over the counter at any drug store.  Follow package instructions., Disp: 56 each, Rfl:   Observations/Objective: Patient is well-developed, well-nourished in no acute distress.  Resting comfortably at home.  Head is normocephalic, atraumatic.  No labored breathing. Speech is clear and coherent with logical content.  Patient is alert and oriented at baseline.    Assessment and Plan: 1. Costochondral pain  -Advised Pt to continue Tylenol and Ibuprofen as needed. -Advised patient if she has severe or uncomfortable chest pain to follow up in the emergency room  -Advised Pt to also follow up with PCP if symptoms persist.  -Patient verbalized understanding.   Follow Up Instructions: I discussed the assessment and treatment plan with the patient. The patient was provided an opportunity to ask questions and all were answered. The patient agreed with the plan and demonstrated an understanding of the instructions.  A copy of instructions were sent to the patient via MyChart unless otherwise noted below.     The patient was advised to call back or seek an in-person evaluation if the symptoms worsen or if the condition fails to improve as anticipated.  Time:  I spent 15 minutes with the patient via telehealth technology discussing the above problems/concerns.    Wendy Pandy, PA-C

## 2023-01-02 ENCOUNTER — Ambulatory Visit (HOSPITAL_COMMUNITY): Payer: BC Managed Care – PPO

## 2023-01-05 ENCOUNTER — Ambulatory Visit (HOSPITAL_COMMUNITY)
Admission: RE | Admit: 2023-01-05 | Discharge: 2023-01-05 | Disposition: A | Discharge status: Home / Self Care | Payer: BC Managed Care – PPO | Source: Ambulatory Visit

## 2023-01-05 ENCOUNTER — Encounter (HOSPITAL_COMMUNITY): Payer: Self-pay

## 2023-01-05 VITALS — BP 190/110 | HR 81 | Temp 98.1°F | Resp 16

## 2023-01-05 DIAGNOSIS — K1120 Sialoadenitis, unspecified: Secondary | ICD-10-CM | POA: Diagnosis not present

## 2023-01-05 NOTE — ED Triage Notes (Addendum)
Pt presents to [facility] with co of swelling on chin and ears and she was seen by her pcp and was told it was a virus. Pt is concerned for mumps. Jaw pain on left side is bothering her now.

## 2023-01-05 NOTE — ED Provider Notes (Signed)
[facility]    CSN: [id number] Arrival date & time: 01/05/23  1843      History   Chief Complaint Chief Complaint  Patient presents with   Facial Pain    Still having some jaw issues after a virus in salivary glands. - Entered by patient    HPI Wendy Hooper is a 57 y.o. female.   Patient was seen via e-visit on 6/6 for concerns of left-sided facial swelling and diagnosed with parotitis.  She was placed on Levaquin for potential bacterial etiology.  Reports noncompliance with the Levaquin, has taken it about every other day due to GI upset.  She reports she has seen her primary care provider in person since then, was reassured that this is probably not mumps.  Her primary care provider also did not feel any swollen glands.  Since her ED visit, patient reports her swelling has gradually improved.  She denies any fevers.  She presents to clinic today for concerns of lymph node swelling and continued infection.     The history is provided by the patient and medical records.    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      Home Medications    Prior to Admission medications   Medication Sig Start Date End Date Taking? Authorizing Provider  acetaminophen (TYLENOL) 500 MG tablet You can take 2 tablets every 6 hours as needed for pain.  Use this first.  You can buy this over the counter at any drug store.  Do not exceed 4000 mg of Tylenol per day, it can harm your liver. 01/25/18   Sherrie George, PA-C  amLODipine  (NORVASC) 5 MG tablet Take by mouth.    [provider]  ibuprofen (ADVIL,MOTRIN) 200 MG tablet You can take 2-3 tablets every 6 hours as needed for pain.  This should be your second choice for pain control.  You can buy this over the counter at any drug store. 01/25/18   Sherrie George, PA-C  nitrofurantoin, macrocrystal-monohydrate, (MACROBID) 100 MG capsule Take 1 capsule (100 mg total) by mouth 2 (two) times daily. 07/09/22   Debby Freiberg, NP  propranolol (INDERAL) 20 MG tablet Take by mouth. 10/08/19   [provider]  psyllium (HYDROCIL/METAMUCIL) 95 % PACK You want to have 1-2 soft Bowel movements per day.  This is your first line drug for this.  It does not work overnight, it is something you have to take daily.  I will let you decide if and how much you need, most people start with 1 scoop per day.You can buy this over the counter at any drug store.  Follow package instructions. 01/25/18   Sherrie George, PA-C    Family History Family History  Problem Relation Age of Onset   Hypertension Mother    Hyperlipidemia Mother    Hyperlipidemia Father    COPD Father     Social History Social History   Tobacco Use   Smoking status: Never   Smokeless tobacco: Never  Vaping Use   Vaping Use: Never used  Substance Use Topics   Alcohol use: No   Drug use: No     Allergies   Augmentin [amoxicillin-pot clavulanate] and Claritin [loratadine]   Review of Systems Review of Systems  Constitutional:  Negative for fever.  HENT:  Negative for congestion and sore throat.   Respiratory:  Negative for cough and shortness of breath.   Cardiovascular:  Negative for chest pain.     Physical Exam Triage Vital Signs ED Triage Vitals  Enc Vitals Group     BP 01/05/23 1905 (!) 190/110     Pulse Rate 01/05/23 1905 81     Resp 01/05/23 1905 16     Temp 01/05/23 1905 98.1 F (36.7 C)     Temp src --      SpO2 01/05/23 1905 96 %     Weight --      Height --       Head Circumference --      Peak Flow --      Pain Score 01/05/23 1904 3     Pain Loc --      Pain Edu? --      Excl. in GC? --    No data found.  Updated Vital Signs BP (!) 190/110   Pulse 81   Temp 98.1 F (36.7 C)   Resp 16   LMP 10/04/2016 (Approximate) Comment: denies pregnancy  SpO2 96%   Visual Acuity Right Eye Distance:   Left Eye Distance:   Bilateral Distance:    Right Eye Near:   Left Eye Near:    Bilateral Near:     Physical Exam Vitals and nursing note reviewed.  Constitutional:      Appearance: Normal appearance.  HENT:     Head: Normocephalic and atraumatic.     Right Ear: External ear normal.     Left Ear: External ear normal.     Nose: Nose normal.     Mouth/Throat:     Mouth: Mucous membranes are moist.  Eyes:     Conjunctiva/sclera: Conjunctivae normal.  Cardiovascular:     Rate and Rhythm: Normal rate and regular rhythm.     Heart sounds: Normal heart sounds. No murmur heard. Pulmonary:     Effort: Pulmonary effort is normal. No respiratory distress.     Breath sounds: Normal breath sounds.  Musculoskeletal:        General: No swelling. Normal range of motion.  Lymphadenopathy:     Cervical: No cervical adenopathy.  Skin:    General: Skin is warm and dry.  Neurological:     General: No focal deficit present.     Mental Status: She is alert.  Psychiatric:        Mood and Affect: Mood normal.      UC Treatments / Results  Labs (all labs ordered are listed, but only abnormal results are displayed) Labs Reviewed - No data to display  EKG   Radiology No results found.  Procedures Procedures (including critical care time)  Medications Ordered in UC Medications - No data to display  Initial Impression / Assessment and Plan / UC Course  I have reviewed the triage vital signs and the nursing notes.  Pertinent labs & imaging results that were available during my care of the patient were reviewed by me and considered in my  medical decision making (see chart for details).  Vitals and triage reviewed, patient is hemodynamically stable.  Without cervical  LAD on physical exam, uvula is midline, posterior pharynx without erythema.  No obvious swelling appreciated, no palpable masses.  Patient is afebrile and without tachycardia.  Does not appear to have a bacterial infection, could be resolving parotitis.  Encouraged to keep ENT appointment.  Emergency and return precautions given, no questions at this time.     Final Clinical Impressions(s) / UC Diagnoses   Final diagnoses:  Parotitis     Discharge Instructions      You did not have any obvious swelling or palpable masses today. Most infections of the parotid gland are viral.  Please do not take any more of the antibiotics.  It is a good idea to follow-up with your dentist, and to keep your ear nose and throat appointment.  Please seek immediate care if you develop severe one-sided facial swelling, trouble swallowing, shortness of breath, as further imaging with a CT scan may be indicated.  Please return to clinic for any new or concerning symptoms.      ED Prescriptions   None    PDMP not reviewed this encounter.   Jachob Mcclean, [place], [place] 01/05/23 [phone]

## 2023-01-05 NOTE — Discharge Instructions (Addendum)
You did not have any obvious swelling or palpable masses today. Most infections of the parotid gland are viral.  Please do not take any more of the antibiotics.  It is a good idea to follow-up with your dentist, and to keep your ear nose and throat appointment.  Please seek immediate care if you develop severe one-sided facial swelling, trouble swallowing, shortness of breath, as further imaging with a CT scan may be indicated.  Please return to clinic for any new or concerning symptoms.

## 2023-01-26 ENCOUNTER — Ambulatory Visit (HOSPITAL_COMMUNITY): Payer: BC Managed Care – PPO

## 2023-01-27 ENCOUNTER — Ambulatory Visit (HOSPITAL_COMMUNITY): Payer: BC Managed Care – PPO

## 2023-03-29 ENCOUNTER — Telehealth: Payer: BC Managed Care – PPO | Admitting: Physician Assistant

## 2023-03-29 DIAGNOSIS — L249 Irritant contact dermatitis, unspecified cause: Secondary | ICD-10-CM

## 2023-03-29 MED ORDER — PREDNISONE 20 MG PO TABS: 40.0000 mg | ORAL_TABLET | Freq: Every day | ORAL | 0 refills | Status: DC

## 2023-03-29 MED ORDER — HYDROXYZINE PAMOATE 25 MG PO CAPS: 25.0000 mg | ORAL_CAPSULE | Freq: Three times a day (TID) | ORAL | 0 refills | Status: DC | PRN

## 2023-03-29 NOTE — Progress Notes (Signed)
Virtual Visit Consent   Wendy Hooper, you are scheduled for a virtual visit with a [facility] provider today. Just as with appointments in the office, your consent must be obtained to participate. Your consent will be active for this visit and any virtual visit you may have with one of our providers in the next 365 days. If you have a MyChart account, a copy of this consent can be sent to you electronically.  As this is a virtual visit, video technology does not allow for your provider to perform a traditional examination. This may limit your provider's ability to fully assess your condition. If your provider identifies any concerns that need to be evaluated in person or the need to arrange testing (such as labs, EKG, etc.), we will make arrangements to do so. Although advances in technology are sophisticated, we cannot ensure that it will always work on either your end or our end. If the connection with a video visit is poor, the visit may have to be switched to a telephone visit. With either a video or telephone visit, we are not always able to ensure that we have a secure connection.  By engaging in this virtual visit, you consent to the provision of healthcare and authorize for your insurance to be billed (if applicable) for the services provided during this visit. Depending on your insurance coverage, you may receive a charge related to this service.  I need to obtain your verbal consent now. Are you willing to proceed with your visit today? Wendy Hooper has provided verbal consent on 03/29/2023 for a virtual visit (video or telephone). Piedad Climes, [place]  Date: 03/29/2023 7:00 PM  Virtual Visit via Video Note   I, Piedad Climes, connected with  Wendy Hooper  ([id number], Apr 12, 1966) on 03/29/23 at  7:00 PM EDT by a video-enabled telemedicine application and verified that I am speaking with the correct person using two identifiers.  Location: Patient: Virtual Visit Location  Patient: Home Provider: Virtual Visit Location Provider: Home Office   I discussed the limitations of evaluation and management by telemedicine and the availability of in person appointments. The patient expressed understanding and agreed to proceed.    History of Present Illness: Wendy Hooper is a 57 y.o. who identifies as a female who was assigned female at birth, and is being seen today for vaginal/inguinal irritation over the past few days.   Attempted to dye her hair while in the bathtub and some of the dye got in the water. Some irritation of skin at that point but was ok overall. Since Monday noting vaginal soreness and irritation with raw skin of inner thighs. Denies vaginal lesions or discharge. Denies fever, chills. 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  Use this first.  You can buy this over the counter at any drug store.  Do not exceed 4000 mg of Tylenol per day, it can harm your liver., Disp: 30 tablet, Rfl: 0   amLODipine (NORVASC) 5 MG tablet, Take by mouth., Disp: , Rfl:    ibuprofen (ADVIL,MOTRIN) 200 MG tablet, You can take 2-3 tablets every 6 hours as needed for pain.  This should be your second choice for pain control.  You can buy this over the counter at any drug store.,  Disp: , Rfl:    propranolol (INDERAL) 20 MG tablet, Take by mouth., Disp: , Rfl:    psyllium (HYDROCIL/METAMUCIL) 95 % PACK, You want to have 1-2 soft Bowel movements per day.  This is your first line drug for this.  It does not work overnight, it is something you have to take daily.  I will let you decide if and how much you need, most people start with 1 scoop per day.You can buy this over the counter at any drug store.  Follow package instructions., Disp: 56 each, Rfl:   Observations/Objective: Patient is well-developed, well-nourished in no acute distress.  Resting comfortably at home.  Head is normocephalic, atraumatic.  No labored breathing. Speech is clear and coherent with logical content.  Patient is alert and oriented at baseline.   Assessment and Plan: 1. Irritant dermatitis - hydrOXYzine (VISTARIL) 25 MG capsule; Take 1 capsule (25 mg total) by mouth every 8 (eight) hours as needed.  Dispense: 15 capsule; Refill: 0 - predniSONE (DELTASONE) 20 MG tablet; Take 2 tablets (40 mg total) by mouth daily with breakfast.  Dispense: 10 tablet; Refill: 0  In inguinal/vaginal area. Supportive measures and OTC medications reviewed. Hydroxyzine and prednisone per orders. Strict in-person follow-up precautions discussed with patient.   Follow Up Instructions: I discussed the assessment and treatment plan with the patient. The patient was provided an opportunity to ask questions and all were answered. The patient agreed with the plan and demonstrated an understanding of the instructions.  A copy of instructions were sent to the patient via MyChart unless otherwise noted below.   The patient was advised to call back or seek an in-person evaluation if the symptoms worsen or if the condition fails to improve as anticipated.  Time:  I spent 10 minutes with the patient via telehealth technology discussing the above problems/concerns.    Piedad Climes, PA-C

## 2023-03-29 NOTE — Patient Instructions (Signed)
  Wendy Hooper, thank you for joining Piedad Climes, PA-C for today's virtual visit.  While this provider is not your primary care provider (PCP), if your PCP is located in our provider database this encounter information will be shared with them immediately following your visit.   A [facility] MyChart account gives you access to today's visit and all your visits, tests, and labs performed at [facility] " click here if you don't have a [facility] MyChart account or go to [email or web address]  Consent: (Patient) Wendy Hooper provided verbal consent for this virtual visit at the beginning of the encounter.  Current Medications:  Current Outpatient Medications:    acetaminophen (TYLENOL) 500 MG tablet, You can take 2 tablets every 6 hours as needed for pain.  Use this first.  You can buy this over the counter at any drug store.  Do not exceed 4000 mg of Tylenol per day, it can harm your liver., Disp: 30 tablet, Rfl: 0   amLODipine (NORVASC) 5 MG tablet, Take by mouth., Disp: , Rfl:    ibuprofen (ADVIL,MOTRIN) 200 MG tablet, You can take 2-3 tablets every 6 hours as needed for pain.  This should be your second choice for pain control.  You can buy this over the counter at any drug store., Disp: , Rfl:    nitrofurantoin, macrocrystal-monohydrate, (MACROBID) 100 MG capsule, Take 1 capsule (100 mg total) by mouth 2 (two) times daily., Disp: 10 capsule, Rfl: 0   propranolol (INDERAL) 20 MG tablet, Take by mouth., Disp: , Rfl:    psyllium (HYDROCIL/METAMUCIL) 95 % PACK, You want to have 1-2 soft Bowel movements per day.  This is your first line drug for this.  It does not work overnight, it is something you have to take daily.  I will let you decide if and how much you need, most people start with 1 scoop per day.You can buy this over the counter at any drug store.  Follow package instructions., Disp: 56 each, Rfl:    Medications ordered in this encounter:  No orders of the  defined types were placed in this encounter.    *If you need refills on other medications prior to your next appointment, please contact your pharmacy*  Follow-Up: Call back or seek an in-person evaluation if the symptoms worsen or if the condition fails to improve as anticipated.  [facility] [phone]  Other Instructions Please keep skin clean and dry. Cool compresses when able. Use the hydroxyzine and prednisone as directed. Ok to use cortisone OTC to the skin of the thighs.  If no substantial improvement within 72 hours or anything progressing despite treatment, I want you to seek an in-person evaluation.   If you have been instructed to have an in-person evaluation today at a local Urgent Care facility, please use the link below. It will take you to a list of all of our available [facility], including address, phone number and hours of operation. Please do not delay care.  [facility]  If you or a family member do not have a primary care provider, use the link below to schedule a visit and establish care. When you choose a [facility] primary care physician or advanced practice provider, you gain a long-term partner in health. Find a Primary Care Provider  Learn more about [facility]'s in-office and virtual care options:  - Get Care Now

## 2023-05-06 ENCOUNTER — Ambulatory Visit (INDEPENDENT_AMBULATORY_CARE_PROVIDER_SITE_OTHER): Payer: BC Managed Care – PPO

## 2023-05-06 ENCOUNTER — Encounter (HOSPITAL_COMMUNITY): Payer: Self-pay

## 2023-05-06 ENCOUNTER — Ambulatory Visit (HOSPITAL_COMMUNITY)
Admission: EM | Admit: 2023-05-06 | Discharge: 2023-05-06 | Disposition: A | Discharge status: Home / Self Care | Payer: BC Managed Care – PPO | Attending: Internal Medicine | Admitting: Internal Medicine

## 2023-05-06 DIAGNOSIS — S93492A Sprain of other ligament of left ankle, initial encounter: Secondary | ICD-10-CM

## 2023-05-06 NOTE — ED Provider Notes (Signed)
[facility]    CSN: [id number] Arrival date & time: 05/06/23  1731      History   Chief Complaint Chief Complaint  Patient presents with   Fall   Ankle Injury    HPI Wendy Hooper is a 58 y.o. female.   57 year old female who presents to urgent care with complaints of left ankle pain.  She fell a few hours ago while taking the trash out while walking down the stairs.  She rolled over her ankle and felt something pop.  She has fractured her right ankle in a similar fashion last year.  She is able to bear weight on the area but is very tender and has started to bruise and swell.  She denies any numbness or tingling in the area.  She did not suffer any other injuries during the fall.  She immediately put ice on the area and has been wearing a ankle boot that she had for her other ankle.  She is able to move the ankle with the exception of medial rotation   Fall Pertinent negatives include no chest pain, no abdominal pain and no shortness of breath.  Ankle Injury Pertinent negatives include no chest pain, no abdominal pain and no shortness of breath.    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      Home Medications    Prior to Admission medications   Medication Sig Start Date End Date Taking? Authorizing Provider  amLODipine (NORVASC) 5 MG tablet Take by mouth.   Yes [provider]  ibuprofen (ADVIL,MOTRIN) 200 MG tablet You can take 2-3 tablets every 6 hours as needed for pain.  This should be your second  choice for pain control.  You can buy this over the counter at any drug store. 01/25/18  Yes Sherrie George, PA-C  propranolol (INDERAL) 20 MG tablet Take by mouth. 10/08/19  Yes [provider]  acetaminophen (TYLENOL) 500 MG tablet You can take 2 tablets every 6 hours as needed for pain.  Use this first.  You can buy this over the counter at any drug store.  Do not exceed 4000 mg of Tylenol per day, it can harm your liver. 01/25/18   Sherrie George, PA-C  hydrOXYzine (VISTARIL) 25 MG capsule Take 1 capsule (25 mg total) by mouth every 8 (eight) hours as needed. 03/29/23   Waldon Merl, PA-C  predniSONE (DELTASONE) 20 MG tablet Take 2 tablets (40 mg total) by mouth daily with breakfast. 03/29/23   Waldon Merl, PA-C  psyllium (HYDROCIL/METAMUCIL) 95 % PACK You want to have 1-2 soft Bowel movements per day.  This is your first line drug for this.  It does not work overnight, it is something you have to take daily.  I will let you decide if and how much you need, most people start with 1 scoop per day.You can buy this over the counter at any drug store.  Follow package instructions. 01/25/18   Sherrie George, PA-C    Family  History Family History  Problem Relation Age of Onset   Hypertension Mother    Hyperlipidemia Mother    Hyperlipidemia Father    COPD Father     Social History Social History   Tobacco Use   Smoking status: Never   Smokeless tobacco: Never  Vaping Use   Vaping status: Never Used  Substance Use Topics   Alcohol use: No   Drug use: No     Allergies   Augmentin [amoxicillin-pot clavulanate] and Claritin [loratadine]   Review of Systems Review of Systems  Constitutional:  Negative for chills and fever.  HENT:  Negative for ear pain and sore throat.   Eyes:  Negative for pain and visual disturbance.  Respiratory:  Negative for cough and shortness of breath.   Cardiovascular:  Negative for chest pain and palpitations.  Gastrointestinal:   Negative for abdominal pain and vomiting.  Genitourinary:  Negative for dysuria and hematuria.  Musculoskeletal:  Negative for arthralgias and back pain.  Skin:  Negative for color change and rash.  Neurological:  Negative for seizures and syncope.  All other systems reviewed and are negative.    Physical Exam Triage Vital Signs ED Triage Vitals  Encounter Vitals Group     BP 05/06/23 1744 (!) 157/102     Systolic BP Percentile --      Diastolic BP Percentile --      Pulse Rate 05/06/23 1744 (!) 113     Resp 05/06/23 1744 18     Temp 05/06/23 1744 97.6 F (36.4 C)     Temp Source 05/06/23 1744 Oral     SpO2 05/06/23 1744 97 %     Weight 05/06/23 1744 290 lb (131.5 kg)     Height 05/06/23 1744 5\' 3"  (1.6 m)     Head Circumference --      Peak Flow --      Pain Score 05/06/23 1742 7     Pain Loc --      Pain Education --      Exclude from Growth Chart --    No data found.  Updated Vital Signs BP (!) 157/102 (BP Location: Left Wrist)   Pulse (!) 113   Temp 97.6 F (36.4 C) (Oral)   Resp 18   Ht 5\' 3"  (1.6 m)   Wt 290 lb (131.5 kg)   LMP 10/04/2016 (Approximate) Comment: denies pregnancy  SpO2 97%   BMI 51.37 kg/m   Visual Acuity Right Eye Distance:   Left Eye Distance:   Bilateral Distance:    Right Eye Near:   Left Eye Near:    Bilateral Near:     Physical Exam Vitals and nursing note reviewed.  Constitutional:      General: She is not in acute distress.    Appearance: She is well-developed.  HENT:     Head: Normocephalic and atraumatic.  Eyes:     Conjunctiva/sclera: Conjunctivae normal.  Cardiovascular:     Rate and Rhythm: Normal rate and regular rhythm.     Heart sounds: No murmur heard. Pulmonary:     Effort: Pulmonary effort is normal. No respiratory distress.     Breath sounds: Normal breath sounds.  Abdominal:     Palpations: Abdomen is soft.     Tenderness: There is no abdominal tenderness.  Musculoskeletal:        General: No swelling.      Cervical back: Neck supple.       Feet:  Skin:  General: Skin is warm and dry.     Capillary Refill: Capillary refill takes less than 2 seconds.  Neurological:     Mental Status: She is alert.  Psychiatric:        Mood and Affect: Mood normal.      UC Treatments / Results  Labs (all labs ordered are listed, but only abnormal results are displayed) Labs Reviewed - No data to display  EKG   Radiology No results found.  Procedures Procedures (including critical care time)  Medications Ordered in UC Medications - No data to display  Initial Impression / Assessment and Plan / UC Course  I have reviewed the triage vital signs and the nursing notes.  Pertinent labs & imaging results that were available during my care of the patient were reviewed by me and considered in my medical decision making (see chart for details).     Sprain of other ligament of left ankle, initial encounter   Likely high grade sprain of the left ankle vs small distal fibular fracture but final x-ray read by radiologist will be later tonight. Use ankle support boot when walking. Ok to bear weight with the boot as tolerated but avoid heavy activity. Follow up with orthopedics next week. Ice the ankle several times daily with protecting the skin. Tylenol or ibuprofen for pain. May alternate. Elevate leg to help with swelling.   Return to urgent care or PCP if symptoms worsen or fail to resolve.   Final Clinical Impressions(s) / UC Diagnoses   Final diagnoses:  None   Discharge Instructions   None    ED Prescriptions   None    PDMP not reviewed this encounter.   Landis Martins, [place] 05/06/23 1837

## 2023-05-06 NOTE — Discharge Instructions (Addendum)
Likely high grade sprain of the left ankle. Gross read of x-ray appears to show no significant fracture but final x-ray read by radiologist will be later tonight. Use ankle support boot when walking. Ok to bear weight as tolerated but avoid heavy activity. Follow up with orthopedics next week. Ice the ankle several times daily with protecting the skin. Tylenol or ibuprofen for pain. May alternate. Elevate leg to help with swelling.   Return to urgent care or PCP if symptoms worsen or fail to resolve.

## 2023-05-06 NOTE — ED Triage Notes (Signed)
Fall - Left Ankle Injury onset a few hours ago. Patient was taking out the trash and fell down her back stairs. States the left ankle rolled and she thinks she heard something. States she broke her right ankle the same way last year.   The ankle is swollen, bruised, and wearing her old boot to support it.

## 2023-05-07 ENCOUNTER — Ambulatory Visit (HOSPITAL_COMMUNITY): Payer: Self-pay

## 2023-06-13 ENCOUNTER — Ambulatory Visit (HOSPITAL_COMMUNITY): Payer: BC Managed Care – PPO

## 2023-08-07 ENCOUNTER — Telehealth: Payer: Self-pay | Admitting: Physician Assistant

## 2023-08-07 DIAGNOSIS — L089 Local infection of the skin and subcutaneous tissue, unspecified: Secondary | ICD-10-CM

## 2023-08-07 DIAGNOSIS — L729 Follicular cyst of the skin and subcutaneous tissue, unspecified: Secondary | ICD-10-CM

## 2023-08-07 MED ORDER — CEPHALEXIN 500 MG PO CAPS: 500.0000 mg | ORAL_CAPSULE | Freq: Three times a day (TID) | ORAL | 0 refills | Status: AC

## 2023-08-07 NOTE — Progress Notes (Signed)
 Virtual Visit Consent   Wendy Hooper, you are scheduled for a virtual visit with a [facility] provider today. Just as with appointments in the office, your consent must be obtained to participate. Your consent will be active for this visit and any virtual visit you may have with one of our providers in the next 365 days. If you have a MyChart account, a copy of this consent can be sent to you electronically.  As this is a virtual visit, video technology does not allow for your provider to perform a traditional examination. This may limit your provider's ability to fully assess your condition. If your provider identifies any concerns that need to be evaluated in person or the need to arrange testing (such as labs, EKG, etc.), we will make arrangements to do so. Although advances in technology are sophisticated, we cannot ensure that it will always work on either your end or our end. If the connection with a video visit is poor, the visit may have to be switched to a telephone visit. With either a video or telephone visit, we are not always able to ensure that we have a secure connection.  By engaging in this virtual visit, you consent to the provision of healthcare and authorize for your insurance to be billed (if applicable) for the services provided during this visit. Depending on your insurance coverage, you may receive a charge related to this service.  I need to obtain your verbal consent now. Are you willing to proceed with your visit today? Wendy Hooper has provided verbal consent on 08/07/2023 for a virtual visit (video or telephone). Wendy Hooper, [place]  Date: 08/07/2023 6:08 PM  Virtual Visit via Video Note   I, Wendy Hooper, connected with  Alfhild Partch  ([id number], 30-Jul-1965) on 08/07/23 at  6:15 PM EST by a video-enabled telemedicine application and verified that I am speaking with the correct person using two identifiers.  Location: Patient: Virtual Visit Location  Patient: Home Provider: Virtual Visit Location Provider: Home Office   I discussed the limitations of evaluation and management by telemedicine and the availability of in person appointments. The patient expressed understanding and agreed to proceed.    History of Present Illness: Wendy Hooper is a 58 y.o. who identifies as a female who was assigned female at birth, and is being seen today for concern of infected cyst. Patient endorses 2 inguinal cysts of skin noted by her GYN at last check up. No issue until this weekend. Noted one of the areas was red and slightly tender, swollen to a pimple. Took a hot bath and the area seemed to be better but as of this morning, has worsened. Is red and irritated with tenderness. Denies fevers, chills.   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  Use this first.  You can buy this over the counter at any drug store.  Do not exceed 4000 mg of Tylenol   per day, it can harm your liver., Disp: 30 tablet, Rfl: 0   amLODipine (NORVASC) 5 MG tablet, Take by mouth., Disp: , Rfl:    ibuprofen  (ADVIL ,MOTRIN ) 200 MG tablet, You can take 2-3 tablets every 6 hours as needed for pain.  This should be your second choice for pain control.  You can buy this over the counter at any drug store., Disp: , Rfl:    propranolol (INDERAL) 20 MG tablet, Take by mouth., Disp: , Rfl:   Observations/Objective: Patient is well-developed, well-nourished in no  acute distress.  Resting comfortably at home.  Head is normocephalic, atraumatic.  No labored breathing. Speech is clear and coherent with logical content.  Patient is alert and oriented at baseline.   Assessment and Plan: 1. Infected cyst of skin (Primary) - cephALEXin  (KEFLEX ) 500 MG capsule; Take 1 capsule (500 mg total) by mouth 3 (three) times daily for 7 days.  Dispense: 21 capsule; Refill: 0  Concern for infection giving recurrence. Supportive measures and OTC medications reviewed. Keflex  TID for 7 days. Skin care discussed. Avoid use of Neosporin to the area. In person evaluation for any non-resolving, new or worsening symptoms despite treatment.   Follow Up Instructions: I discussed the assessment and treatment plan with the patient. The patient was provided an opportunity to ask questions and all were answered. The patient agreed with the plan and demonstrated an understanding of the instructions.  A copy of instructions were sent to the patient via MyChart unless otherwise noted below.   The patient was advised to call back or seek an in-person evaluation if the symptoms worsen or if the condition fails to improve as anticipated.    Wendy Velma Lunger, PA-C

## 2023-08-07 NOTE — Patient Instructions (Signed)
 Wendy Hooper, thank you for joining Wendy Velma Lunger, Wendy Hooper for today's virtual visit.  While this provider is not your primary care provider (PCP), if your PCP is located in our provider database this encounter information will be shared with them immediately following your visit.   A [facility] MyChart account gives you access to today's visit and all your visits, tests, and labs performed at [facility]  click here if you don't have a [facility] MyChart account or go to [email or web address]  Consent: (Patient) Wendy Hooper provided verbal consent for this virtual visit at the beginning of the encounter.  Current Medications:  Current Outpatient Medications:    acetaminophen  (TYLENOL ) 500 MG tablet, You can take 2 tablets every 6 hours as needed for pain.  Use this first.  You can buy this over the counter at any drug store.  Do not exceed 4000 mg of Tylenol  per day, it can harm your liver., Disp: 30 tablet, Rfl: 0   amLODipine (NORVASC) 5 MG tablet, Take by mouth., Disp: , Rfl:    hydrOXYzine  (VISTARIL ) 25 MG capsule, Take 1 capsule (25 mg total) by mouth every 8 (eight) hours as needed., Disp: 15 capsule, Rfl: 0   ibuprofen  (ADVIL ,MOTRIN ) 200 MG tablet, You can take 2-3 tablets every 6 hours as needed for pain.  This should be your second choice for pain control.  You can buy this over the counter at any drug store., Disp: , Rfl:    predniSONE  (DELTASONE ) 20 MG tablet, Take 2 tablets (40 mg total) by mouth daily with breakfast., Disp: 10 tablet, Rfl: 0   propranolol (INDERAL) 20 MG tablet, Take by mouth., Disp: , Rfl:    psyllium (HYDROCIL/METAMUCIL) 95 % PACK, You want to have 1-2 soft Bowel movements per day.  This is your first line drug for this.  It does not work overnight, it is something you have to take daily.  I will let you decide if and how much you need, most people start with 1 scoop per day.You can buy this over the counter at any drug store.  Follow  package instructions., Disp: 56 each, Rfl:    Medications ordered in this encounter:  No orders of the defined types were placed in this encounter.    *If you need refills on other medications prior to your next appointment, please contact your pharmacy*  Follow-Up: Call back or seek an in-person evaluation if the symptoms worsen or if the condition fails to improve as anticipated.  [facility] [phone]  Other Instructions Please keep the skin clean and dry. Can apply warm compresses to the area for 10 minutes, a few times per day. If not resolving itself over next 24 hours or any new/worsening symptoms, please take the antibiotic as directed. Be sure to follow-up with your GYN as scheduled.    If you have been instructed to have an in-person evaluation today at a local Urgent Care facility, please use the link below. It will take you to a list of all of our available [facility], including address, phone number and hours of operation. Please do not delay care.  [facility]  If you or a family member do not have a primary care provider, use the link below to schedule a visit and establish care. When you choose a [facility] primary care physician or advanced practice provider, you gain a long-term partner in health. Find a Primary Care Provider  Learn more  about [facility]'s in-office and virtual care options: [facility] - Get Care Now

## 2023-10-01 ENCOUNTER — Ambulatory Visit (HOSPITAL_COMMUNITY)

## 2023-10-06 ENCOUNTER — Emergency Department (HOSPITAL_COMMUNITY)
Admission: EM | Admit: 2023-10-06 | Discharge: 2023-10-06 | Disposition: A | Discharge status: Home / Self Care | Payer: Self-pay | Attending: Emergency Medicine | Admitting: Emergency Medicine

## 2023-10-06 ENCOUNTER — Encounter (HOSPITAL_COMMUNITY): Payer: Self-pay

## 2023-10-06 ENCOUNTER — Encounter (HOSPITAL_COMMUNITY): Payer: Self-pay | Admitting: Emergency Medicine

## 2023-10-06 ENCOUNTER — Emergency Department (HOSPITAL_BASED_OUTPATIENT_CLINIC_OR_DEPARTMENT_OTHER): Payer: Self-pay

## 2023-10-06 ENCOUNTER — Other Ambulatory Visit: Payer: Self-pay

## 2023-10-06 ENCOUNTER — Inpatient Hospital Stay (HOSPITAL_COMMUNITY)
Admission: RE | Admit: 2023-10-06 | Discharge: 2023-10-06 | Disposition: A | Discharge status: Home / Self Care | Payer: Self-pay | Source: Ambulatory Visit

## 2023-10-06 VITALS — BP 155/88 | HR 102 | Temp 97.8°F | Resp 18 | Ht 63.0 in | Wt 289.9 lb

## 2023-10-06 DIAGNOSIS — M7989 Other specified soft tissue disorders: Secondary | ICD-10-CM

## 2023-10-06 DIAGNOSIS — R2241 Localized swelling, mass and lump, right lower limb: Secondary | ICD-10-CM | POA: Diagnosis not present

## 2023-10-06 DIAGNOSIS — I10 Essential (primary) hypertension: Secondary | ICD-10-CM | POA: Insufficient documentation

## 2023-10-06 DIAGNOSIS — M79651 Pain in right thigh: Secondary | ICD-10-CM

## 2023-10-06 DIAGNOSIS — Z79899 Other long term (current) drug therapy: Secondary | ICD-10-CM | POA: Diagnosis not present

## 2023-10-06 NOTE — ED Notes (Signed)
 Pt transported to vascular.

## 2023-10-06 NOTE — ED Triage Notes (Signed)
 Pt reports right leg swelling since Sunday. Called PMD who wanted pt r/o DVT. Noted elevated vital signs for patient. Pt states she has POTTs and vitals are typically able normal when sitting upright. Pt currently asysmptomatic. No distress noted.

## 2023-10-06 NOTE — Discharge Instructions (Signed)
 Right upper leg pain -Take ibuprofen or Tylenol as needed for pain and inflammation to the right upper leg. -Continue to monitor for any changes symptoms such as increased leg swelling, increased lower leg swelling, increased redness, increased warmth, increased pain. -If you are experiencing any escalation of current symptoms follow-up in ER immediately for further evaluation management. -If symptoms continue through the weekend and you would like further evaluation with lower extremity ultrasound to rule out DVT or lymphedema, follow-up for further evaluation and management. -Based on presentation and history of events, minimal concern for DVT and more concern for muscle strain as the cause of symptoms. -Apply ice to the area 2-3 times a day for 10 to 15 minutes at a time to help with inflammation and pain

## 2023-10-06 NOTE — ED Provider Notes (Signed)
 [place] EMERGENCY DEPARTMENT AT [facility] Provider Note   CSN: [id number] Arrival date & time: 10/06/23  1254     History  Chief Complaint  Patient presents with   Leg Swelling    Verlin Duke is a 58 y.o. female with medical history of dizziness and hypertension.  Patient presents to ED for evaluation of swelling to her posterior right thigh.  States that this began last Sunday.  States that after few days the swelling to go down however returned 3 days ago.  Denies history of blood clots.  States that she was discussing this with her weight loss doctor who advised her to come to the ED for DVT rule out.  Denies recent surgery or travel.  Denies chest pain or shortness of breath or denies fevers, overlying skin change to posterior right thigh.  States she is able to ambulate on right leg without difficulty.  Denies pain.  Just states that it is a "mass".  HPI     Home Medications Prior to Admission medications   Medication Sig Start Date End Date Taking? Authorizing Provider  amLODipine (NORVASC) 5 MG tablet Take by mouth.    [provider]  ibuprofen (ADVIL,MOTRIN) 200 MG tablet You can take 2-3 tablets every 6 hours as needed for pain.  This should be your second choice for pain control.  You can buy this over the counter at any drug store. 01/25/18   Sherrie George, PA-C  Phentermine HCl [facility]) 8 MG TABS Half to whole tablet daily at noon 08/15/23   [provider]      Allergies    Augmentin [amoxicillin-pot clavulanate] and Claritin [loratadine]    Review of Systems   Review of Systems  All other systems reviewed and are negative.   Physical Exam Updated Vital Signs BP (!) 149/78 (BP Location: Right Arm)   Pulse 90   Temp 98 F (36.7 C) (Oral)   Resp 15   Ht 5\' 3"  (1.6 m)   Wt 133.8 kg   LMP 10/04/2016 (Approximate) Comment: denies pregnancy  SpO2 100%   BMI 52.26 kg/m  Physical Exam Vitals and nursing note reviewed.   Constitutional:      General: She is not in acute distress.    Appearance: She is well-developed.  HENT:     Head: Normocephalic and atraumatic.  Eyes:     Conjunctiva/sclera: Conjunctivae normal.  Cardiovascular:     Rate and Rhythm: Normal rate and regular rhythm.     Heart sounds: No murmur heard. Pulmonary:     Effort: Pulmonary effort is normal. No respiratory distress.     Breath sounds: Normal breath sounds.  Abdominal:     Palpations: Abdomen is soft.     Tenderness: There is no abdominal tenderness.  Musculoskeletal:        General: No swelling.     Cervical back: Neck supple.  Skin:    General: Skin is warm and dry.     Capillary Refill: Capillary refill takes less than 2 seconds.     Comments: Soft, mobile, nontender mass to posterior right thigh.  No overlying skin change.  No erythema to suggest cellulitis.  Negative Homans' sign.  Neurological:     Mental Status: She is alert.  Psychiatric:        Mood and Affect: Mood normal.     ED Results / Procedures / Treatments   Labs (all labs ordered are listed, but only abnormal results are displayed) Labs  Reviewed - No data to display  EKG None  Radiology VAS [place] LOWER EXTREMITY VENOUS (DVT) (ONLY [facility] & [facility]) Result Date: 10/06/2023  Lower Venous DVT Study Patient Name:  YESENA REAVES  Date of Exam:   10/06/2023 Medical Rec #: [id number]      Accession #:    [id number] Date of Birth: March 16, 1966      Patient Gender: F Patient Age:   68 years Exam Location:  [facility] Procedure:      VAS [place] LOWER EXTREMITY VENOUS (DVT) Referring Phys: Benjiman Core --------------------------------------------------------------------------------  Indications: Posterior thigh swelling and erythema.  Comparison Study: Prior negative right LEV done 01/27/18 Performing Technologist: Sherren Kerns RVS  Examination Guidelines: A complete evaluation includes B-mode imaging, spectral Doppler, color Doppler, and power Doppler as needed of  all accessible portions of each vessel. Bilateral testing is considered an integral part of a complete examination. Limited examinations for reoccurring indications may be performed as noted. The reflux portion of the exam is performed with the patient in reverse Trendelenburg.  +---------+---------------+---------+-----------+----------+--------------+ RIGHT    CompressibilityPhasicitySpontaneityPropertiesThrombus Aging +---------+---------------+---------+-----------+----------+--------------+ CFV      Full           Yes      Yes                                 +---------+---------------+---------+-----------+----------+--------------+ SFJ      Full                                                        +---------+---------------+---------+-----------+----------+--------------+ FV Prox  Full                                                        +---------+---------------+---------+-----------+----------+--------------+ FV Mid   Full                                                        +---------+---------------+---------+-----------+----------+--------------+ FV DistalFull           Yes      Yes                                 +---------+---------------+---------+-----------+----------+--------------+ PFV      Full                                                        +---------+---------------+---------+-----------+----------+--------------+ POP      Full           Yes      Yes                                 +---------+---------------+---------+-----------+----------+--------------+  PTV      Full                                                        +---------+---------------+---------+-----------+----------+--------------+ PERO     Full                                                        +---------+---------------+---------+-----------+----------+--------------+ GSV      Full                                                         +---------+---------------+---------+-----------+----------+--------------+   +----+---------------+---------+-----------+----------+--------------+ LEFTCompressibilityPhasicitySpontaneityPropertiesThrombus Aging +----+---------------+---------+-----------+----------+--------------+ CFV Full           Yes      Yes                                 +----+---------------+---------+-----------+----------+--------------+     Summary: RIGHT: - There is no evidence of deep vein thrombosis in the lower extremity. - There is no evidence of superficial venous thrombosis.  - No cystic structure found in the popliteal fossa.  LEFT: - No evidence of common femoral vein obstruction.   *See table(s) above for measurements and observations. Electronically signed by Heath Lark on 10/06/2023 at 2:31:06 PM.    Final     Procedures Procedures   Medications Ordered in ED Medications - No data to display  ED Course/ Medical Decision Making/ A&P  Medical Decision Making  58 year old female presents for evaluation.  Please see HPI for further details.  On examination the patient is afebrile and nontachycardic.  Her lung sounds are clear bilaterally, nonhypoxic.  Abdomen soft and compressible.  Neurological examinations at baseline.  Patient does have a soft, mobile, nontender mass to her posterior right thigh.  There is no overlying skin change to suggest cellulitis.  Negative Homans' sign.  She has no pain to this area, she ambulates without difficulty.  DVT study of patient right lower extremity negative for DVT.  At this time, patient will follow-up with her PCP for further management.  Advised to return to ED with any new or worsening symptoms.  Advised to take ibuprofen and Tylenol for pain in the interim.  She voiced understanding.  Stable to discharge home.   Final Clinical Impression(s) / ED Diagnoses Final diagnoses:  Leg mass, right    Rx / DC Orders ED Discharge Orders     None          Clent Ridges 10/06/23 1450    Benjiman Core, MD 10/06/23 2147

## 2023-10-06 NOTE — ED Notes (Signed)
Pt returned from vascular

## 2023-10-06 NOTE — ED Provider Notes (Signed)
 [facility]  Note:  This document was prepared using Dragon voice recognition software and may include unintentional dictation errors.  MRN: [id number] DOB: May 23, 1966  Subjective:   Wendy Hooper is a 58 y.o. female presenting for right upper leg pain and swelling to the posterior thigh x 1 week.  Patient reports that over 3-day period a week ago she was sitting awkwardly doing work stretching to the right side after which she felt pain to the posterior of her right leg with mild swelling.  Patient denies any redness, warmth, pain to the lower leg.  Patient reports concern for lymphedema as the cause of symptoms.  Patient reached out to her PCP who advised her to come to urgent care for evaluation.  Patient denies any shortness of breath chest pain, weakness, dizziness.  Patient has not taken any over-the-counter medication to treat symptoms.   Current Outpatient Medications:    amLODipine (NORVASC) 5 MG tablet, Take by mouth., Disp: , Rfl:    ibuprofen (ADVIL,MOTRIN) 200 MG tablet, You can take 2-3 tablets every 6 hours as needed for pain.  This should be your second choice for pain control.  You can buy this over the counter at any drug store., Disp: , Rfl:    Phentermine HCl (LOMAIRA) 8 MG TABS, Half to whole tablet daily at noon, Disp: , Rfl:    Allergies  Allergen Reactions   Augmentin [Amoxicillin-Pot Clavulanate] Nausea And Vomiting   Claritin [Loratadine]     Panic attacks    Past Medical History:  Diagnosis Date   Dizziness    Hypertension      Past Surgical History:  Procedure Laterality Date   INCISION AND DRAINAGE PERIRECTAL ABSCESS N/A 01/22/2018   Procedure: IRRIGATION AND DEBRIDEMENT PERIRECTAL ABSCESS;  Surgeon: Almond Lint, MD;  Location: [facility];  Service: General;  Laterality: N/A;    Family History  Problem Relation Age of Onset   Hypertension Mother    Hyperlipidemia Mother    Hyperlipidemia Father    COPD Father     Social History    Tobacco Use   Smoking status: Never   Smokeless tobacco: Never  Vaping Use   Vaping status: Never Used  Substance Use Topics   Alcohol use: No   Drug use: No    ROS Refer to HPI for ROS details.  Objective:   Vitals: BP (!) 155/88 (BP Location: Left Wrist)   Pulse (!) 102   Temp 97.8 F (36.6 C) (Oral)   Resp 18   Ht 5\' 3"  (1.6 m)   Wt 289 lb 14.5 oz (131.5 kg)   LMP 10/04/2016 (Approximate) Comment: denies pregnancy  SpO2 97%   BMI 51.35 kg/m   Physical Exam Vitals and nursing note reviewed.  Constitutional:      General: She is not in acute distress.    Appearance: Normal appearance. She is well-developed. She is not ill-appearing or toxic-appearing.  HENT:     Head: Normocephalic.  Cardiovascular:     Rate and Rhythm: Normal rate.  Pulmonary:     Effort: Pulmonary effort is normal. No respiratory distress.  Skin:    General: Skin is warm and dry.  Neurological:     General: No focal deficit present.     Mental Status: She is alert and oriented to person, place, and time.  Psychiatric:        Mood and Affect: Mood normal.     Procedures  No results found for this or any previous visit (  from the past 24 hours).  Assessment and Plan :   PDMP not reviewed this encounter.  1. Pain of right thigh    Right upper leg pain -Take ibuprofen or Tylenol as needed for pain and inflammation to the right upper leg. -Continue to monitor for any changes symptoms such as increased leg swelling, increased lower leg swelling, increased redness, increased warmth, increased pain. -If you are experiencing any escalation of current symptoms follow-up in ER immediately for further evaluation management. -If symptoms continue through the weekend and you would like further evaluation with lower extremity ultrasound to rule out DVT or lymphedema, follow-up for further evaluation and management. -Based on presentation and history of events, minimal concern for DVT and more  concern for muscle strain as the cause of symptoms. -Apply ice to the area 2-3 times a day for 10 to 15 minutes at a time to help with inflammation and pain  Tonny Bollman, Argyle B, NP 10/06/23 1318

## 2023-10-06 NOTE — ED Triage Notes (Addendum)
 Patient presenting with swelling in the back of the right calf onset this past Sunday. Denies any pain or redness. States the swelling has reduced some but still there. No known injuries.  States first noticed some pain in the right lower groin and then noticed the lower leg swelling. No current pain.  Prescriptions or OTC medications tried: Yes- Advil, Castor oil wrap, Epsom salt baths     with little relief.

## 2023-10-06 NOTE — Progress Notes (Signed)
 VASCULAR LAB    Right lower extremity venous duplex has been performed.  See CV proc for preliminary results.  Relayed results to Delice Bison, PA-C  Rhiley Solem, RVT 10/06/2023, 2:11 PM

## 2023-10-06 NOTE — Discharge Instructions (Signed)
 It was a pleasure taking part in your care.  As discussed, ultrasound here today showed no evidence of DVT.  Please follow-up with your PCP for further management and care.  If you develop any new symptoms, please return to the ED.

## 2023-10-29 ENCOUNTER — Telehealth: Payer: Self-pay | Admitting: Nurse Practitioner

## 2023-10-29 DIAGNOSIS — L243 Irritant contact dermatitis due to cosmetics: Secondary | ICD-10-CM

## 2023-10-29 MED ORDER — HYDROXYZINE PAMOATE 25 MG PO CAPS: 25.0000 mg | ORAL_CAPSULE | Freq: Three times a day (TID) | ORAL | 0 refills | Status: AC | PRN

## 2023-10-29 MED ORDER — PREDNISONE 20 MG PO TABS: 20.0000 mg | ORAL_TABLET | Freq: Two times a day (BID) | ORAL | 0 refills | Status: AC

## 2023-10-29 NOTE — Progress Notes (Signed)
 Virtual Visit Consent   Wendy Hooper, you are scheduled for a virtual visit with a [facility] provider today. Just as with appointments in the office, your consent must be obtained to participate. Your consent will be active for this visit and any virtual visit you may have with one of our providers in the next 365 days. If you have a MyChart account, a copy of this consent can be sent to you electronically.  As this is a virtual visit, video technology does not allow for your provider to perform a traditional examination. This may limit your provider's ability to fully assess your condition. If your provider identifies any concerns that need to be evaluated in person or the need to arrange testing (such as labs, EKG, etc.), we will make arrangements to do so. Although advances in technology are sophisticated, we cannot ensure that it will always work on either your end or our end. If the connection with a video visit is poor, the visit may have to be switched to a telephone visit. With either a video or telephone visit, we are not always able to ensure that we have a secure connection.  By engaging in this virtual visit, you consent to the provision of healthcare and authorize for your insurance to be billed (if applicable) for the services provided during this visit. Depending on your insurance coverage, you may receive a charge related to this service.  I need to obtain your verbal consent now. Are you willing to proceed with your visit today? Wendy Hooper has provided verbal consent on 10/29/2023 for a virtual visit (video or telephone). Viviano Simas, FNP  Date: 10/29/2023 5:26 PM   Virtual Visit via Video Note   I, Viviano Simas, connected with  Wendy Hooper  ([id number], 01/14/1966) on 10/29/23 at  5:30 PM EDT by a video-enabled telemedicine application and verified that I am speaking with the correct person using two identifiers.  Location: Patient: Virtual Visit Location Patient:  Home Provider: Virtual Visit Location Provider: Home Office   I discussed the limitations of evaluation and management by telemedicine and the availability of in person appointments. The patient expressed understanding and agreed to proceed.    History of Present Illness: Wendy Hooper is a 58 y.o. who identifies as a female who was assigned female at birth, and is being seen today for what she believes to be a contact dermatitis   She used a different bath salt that had an orange oil in it  Symptom onset was within 30 minutes of getting out of the bath   She does get irritated easily by changes in soaps etc  She has vaginal itching and a rash on her inner thighs that does burn and sting  Denies vaginal discharge   She had a similar rash a few months ago and was given prednisone & hydroxyzine  for relief   Denies any upcoming surgeries    Problems:  Patient Active Problem List   Diagnosis Date Noted   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  This should be your second choice for pain control.  You can buy this over the counter at any drug store., Disp: , Rfl:    Phentermine HCl (LOMAIRA) 8 MG TABS, Half to whole tablet daily at noon, Disp: , Rfl:   Observations/Objective: Patient is well-developed, well-nourished in no acute distress.  Resting comfortably  at home.  Head is normocephalic, atraumatic.  No labored breathing.  Speech is clear and coherent with logical content.  Patient is alert and oriented at baseline.    Assessment and Plan:  1. Irritant contact dermatitis due to  cosmetics  Avoid contact with scented or dyed soaps/detergents    Meds ordered this encounter  Medications   hydrOXYzine (VISTARIL) 25 MG capsule    Sig: Take 1 capsule (25 mg total) by mouth every 8 (eight) hours as needed.    Dispense:  30 capsule    Refill:  0   predniSONE (DELTASONE) 20 MG tablet    Sig: Take 1 tablet (20 mg total) by mouth 2 (two) times daily with a meal for 5 days.    Dispense:  10 tablet    Refill:  0     Follow Up Instructions: I discussed the assessment and treatment plan with the patient. The patient was provided an opportunity to ask questions and all were answered. The patient agreed with the plan and demonstrated an understanding of the instructions.  A copy of instructions were sent to the patient via MyChart unless otherwise noted below.    The patient was advised to call back or seek an in-person evaluation if the symptoms worsen or if the condition fails to improve as anticipated.    Viviano Simas, FNP
